# Patient Record
Sex: Female | Born: 1965 | Race: White | Hispanic: No | Smoking: Current every day smoker
Health system: Southern US, Community
[De-identification: ages and names within clinical notes are randomized; demographics above are authoritative.]

## PROBLEM LIST (undated history)

## (undated) DIAGNOSIS — Z720 Tobacco use: Secondary | ICD-10-CM

## (undated) DIAGNOSIS — J309 Allergic rhinitis, unspecified: Secondary | ICD-10-CM

## (undated) DIAGNOSIS — F39 Unspecified mood [affective] disorder: Secondary | ICD-10-CM

## (undated) DIAGNOSIS — N83209 Unspecified ovarian cyst, unspecified side: Secondary | ICD-10-CM

## (undated) DIAGNOSIS — K219 Gastro-esophageal reflux disease without esophagitis: Secondary | ICD-10-CM

## (undated) DIAGNOSIS — L680 Hirsutism: Secondary | ICD-10-CM

## (undated) DIAGNOSIS — N879 Dysplasia of cervix uteri, unspecified: Secondary | ICD-10-CM

## (undated) DIAGNOSIS — L919 Hypertrophic disorder of the skin, unspecified: Secondary | ICD-10-CM

## (undated) DIAGNOSIS — F32A Depression, unspecified: Secondary | ICD-10-CM

## (undated) DIAGNOSIS — I10 Essential (primary) hypertension: Secondary | ICD-10-CM

## (undated) DIAGNOSIS — F419 Anxiety disorder, unspecified: Secondary | ICD-10-CM

## (undated) DIAGNOSIS — G4700 Insomnia, unspecified: Secondary | ICD-10-CM

## (undated) DIAGNOSIS — C801 Malignant (primary) neoplasm, unspecified: Secondary | ICD-10-CM

## (undated) DIAGNOSIS — N812 Incomplete uterovaginal prolapse: Secondary | ICD-10-CM

## (undated) DIAGNOSIS — L909 Atrophic disorder of skin, unspecified: Secondary | ICD-10-CM

## (undated) DIAGNOSIS — M47816 Spondylosis without myelopathy or radiculopathy, lumbar region: Secondary | ICD-10-CM

## (undated) DIAGNOSIS — J45909 Unspecified asthma, uncomplicated: Secondary | ICD-10-CM

## (undated) DIAGNOSIS — F329 Major depressive disorder, single episode, unspecified: Secondary | ICD-10-CM

## (undated) DIAGNOSIS — IMO0002 Reserved for concepts with insufficient information to code with codable children: Secondary | ICD-10-CM

## (undated) HISTORY — DX: Insomnia, unspecified: G47.00

## (undated) HISTORY — DX: Unspecified mood (affective) disorder: F39

## (undated) HISTORY — DX: Malignant (primary) neoplasm, unspecified: C80.1

## (undated) HISTORY — DX: Atrophic disorder of skin, unspecified: L90.9

## (undated) HISTORY — DX: Incomplete uterovaginal prolapse: N81.2

## (undated) HISTORY — DX: Hirsutism: L68.0

## (undated) HISTORY — PX: ABDOMINAL HYSTERECTOMY: SHX81

## (undated) HISTORY — DX: Allergic rhinitis, unspecified: J30.9

## (undated) HISTORY — DX: Reserved for concepts with insufficient information to code with codable children: IMO0002

## (undated) HISTORY — PX: OTHER SURGICAL HISTORY: SHX169

## (undated) HISTORY — DX: Gastro-esophageal reflux disease without esophagitis: K21.9

## (undated) HISTORY — DX: Unspecified asthma, uncomplicated: J45.909

## (undated) HISTORY — DX: Dysplasia of cervix uteri, unspecified: N87.9

## (undated) HISTORY — DX: Hypertrophic disorder of the skin, unspecified: L91.9

## (undated) HISTORY — DX: Unspecified ovarian cyst, unspecified side: N83.209

## (undated) HISTORY — PX: CHOLECYSTECTOMY: SHX55

---

## 2007-05-28 ENCOUNTER — Ambulatory Visit: Payer: Self-pay | Admitting: Unknown Physician Specialty

## 2007-06-05 ENCOUNTER — Emergency Department: Payer: Self-pay | Admitting: Emergency Medicine

## 2007-07-05 ENCOUNTER — Emergency Department: Payer: Self-pay | Admitting: Emergency Medicine

## 2009-07-12 ENCOUNTER — Emergency Department: Payer: Self-pay | Admitting: Emergency Medicine

## 2009-09-03 ENCOUNTER — Ambulatory Visit: Payer: Self-pay | Admitting: Family Medicine

## 2009-11-24 ENCOUNTER — Encounter: Payer: Self-pay | Admitting: Family Medicine

## 2009-12-19 ENCOUNTER — Encounter: Payer: Self-pay | Admitting: Family Medicine

## 2010-02-02 ENCOUNTER — Encounter: Payer: Self-pay | Admitting: Family Medicine

## 2010-02-11 ENCOUNTER — Ambulatory Visit: Payer: Self-pay

## 2010-02-17 ENCOUNTER — Ambulatory Visit: Payer: Self-pay

## 2010-02-18 ENCOUNTER — Encounter: Payer: Self-pay | Admitting: Family Medicine

## 2010-03-21 ENCOUNTER — Encounter: Payer: Self-pay | Admitting: Family Medicine

## 2010-08-10 ENCOUNTER — Ambulatory Visit: Payer: Self-pay

## 2010-09-01 ENCOUNTER — Ambulatory Visit: Payer: Self-pay

## 2010-09-08 ENCOUNTER — Inpatient Hospital Stay: Payer: Self-pay

## 2010-09-13 LAB — PATHOLOGY REPORT

## 2011-01-24 ENCOUNTER — Ambulatory Visit: Payer: Self-pay | Admitting: Family Medicine

## 2011-05-19 ENCOUNTER — Other Ambulatory Visit: Payer: Self-pay | Admitting: Neurosurgery

## 2011-05-19 DIAGNOSIS — M545 Low back pain, unspecified: Secondary | ICD-10-CM

## 2011-05-22 ENCOUNTER — Ambulatory Visit
Admission: RE | Admit: 2011-05-22 | Discharge: 2011-05-22 | Disposition: A | Discharge status: Home / Self Care | Payer: Medicaid Other | Source: Ambulatory Visit | Attending: Neurosurgery | Admitting: Neurosurgery

## 2011-05-22 DIAGNOSIS — M545 Low back pain, unspecified: Secondary | ICD-10-CM

## 2012-03-15 ENCOUNTER — Emergency Department (HOSPITAL_COMMUNITY): Payer: Medicaid Other

## 2012-03-15 ENCOUNTER — Encounter (HOSPITAL_COMMUNITY): Payer: Self-pay | Admitting: Emergency Medicine

## 2012-03-15 ENCOUNTER — Other Ambulatory Visit: Payer: Self-pay

## 2012-03-15 ENCOUNTER — Observation Stay (HOSPITAL_COMMUNITY)
Admission: EM | Admit: 2012-03-15 | Discharge: 2012-03-16 | Disposition: A | Discharge status: Home / Self Care | Payer: Medicaid Other | Attending: Internal Medicine | Admitting: Internal Medicine

## 2012-03-15 DIAGNOSIS — F17200 Nicotine dependence, unspecified, uncomplicated: Secondary | ICD-10-CM | POA: Insufficient documentation

## 2012-03-15 DIAGNOSIS — R079 Chest pain, unspecified: Principal | ICD-10-CM | POA: Insufficient documentation

## 2012-03-15 DIAGNOSIS — K219 Gastro-esophageal reflux disease without esophagitis: Secondary | ICD-10-CM

## 2012-03-15 DIAGNOSIS — IMO0002 Reserved for concepts with insufficient information to code with codable children: Secondary | ICD-10-CM

## 2012-03-15 DIAGNOSIS — F329 Major depressive disorder, single episode, unspecified: Secondary | ICD-10-CM | POA: Insufficient documentation

## 2012-03-15 DIAGNOSIS — F3289 Other specified depressive episodes: Secondary | ICD-10-CM | POA: Insufficient documentation

## 2012-03-15 DIAGNOSIS — I10 Essential (primary) hypertension: Secondary | ICD-10-CM

## 2012-03-15 HISTORY — DX: Tobacco use: Z72.0

## 2012-03-15 HISTORY — DX: Major depressive disorder, single episode, unspecified: F32.9

## 2012-03-15 HISTORY — DX: Unspecified ovarian cyst, unspecified side: N83.209

## 2012-03-15 HISTORY — DX: Anxiety disorder, unspecified: F41.9

## 2012-03-15 HISTORY — DX: Essential (primary) hypertension: I10

## 2012-03-15 HISTORY — DX: Gastro-esophageal reflux disease without esophagitis: K21.9

## 2012-03-15 HISTORY — DX: Depression, unspecified: F32.A

## 2012-03-15 HISTORY — DX: Spondylosis without myelopathy or radiculopathy, lumbar region: M47.816

## 2012-03-15 LAB — CBC WITH DIFFERENTIAL/PLATELET
Basophils Absolute: 0.1 10*3/uL (ref 0.0–0.1)
Basophils Relative: 1 % (ref 0–1)
Eosinophils Absolute: 0.1 10*3/uL (ref 0.0–0.7)
MCH: 31.8 pg (ref 26.0–34.0)
MCHC: 33.9 g/dL (ref 30.0–36.0)
Monocytes Relative: 9 % (ref 3–12)
Neutrophils Relative %: 57 % (ref 43–77)
Platelets: 247 10*3/uL (ref 150–400)
RDW: 14.9 % (ref 11.5–15.5)

## 2012-03-15 LAB — COMPREHENSIVE METABOLIC PANEL
AST: 16 U/L (ref 0–37)
Albumin: 4 g/dL (ref 3.5–5.2)
Alkaline Phosphatase: 101 U/L (ref 39–117)
BUN: 7 mg/dL (ref 6–23)
Potassium: 4.1 mEq/L (ref 3.5–5.1)
Sodium: 136 mEq/L (ref 135–145)
Total Protein: 7.6 g/dL (ref 6.0–8.3)

## 2012-03-15 LAB — CARDIAC PANEL(CRET KIN+CKTOT+MB+TROPI)
CK, MB: 1.3 ng/mL (ref 0.3–4.0)
Relative Index: INVALID (ref 0.0–2.5)
Total CK: 46 U/L (ref 7–177)
Troponin I: <0.3 ng/mL (ref <0.30)

## 2012-03-15 LAB — TROPONIN I: Troponin I: <0.3 ng/mL (ref <0.30)

## 2012-03-15 LAB — MRSA PCR SCREENING: MRSA by PCR: NEGATIVE

## 2012-03-15 MED ORDER — ONDANSETRON HCL 4 MG/2ML IJ SOLN: 4.0000 mg | Freq: Four times a day (QID) | INTRAMUSCULAR | Status: DC | PRN

## 2012-03-15 MED ORDER — CLONAZEPAM 1 MG PO TABS
1.0000 mg | ORAL_TABLET | Freq: Three times a day (TID) | ORAL | Status: DC | PRN
Start: 2012-03-15 — End: 2012-03-16
  Administered 2012-03-15: 1 mg via ORAL
  Filled 2012-03-15: qty 1

## 2012-03-15 MED ORDER — ACETAMINOPHEN 500 MG PO TABS
1000.0000 mg | ORAL_TABLET | Freq: Four times a day (QID) | ORAL | Status: DC | PRN
  Filled 2012-03-15: qty 2

## 2012-03-15 MED ORDER — NITROGLYCERIN 0.4 MG SL SUBL
0.4000 mg | SUBLINGUAL_TABLET | SUBLINGUAL | Status: DC | PRN
  Administered 2012-03-15 (×2): 0.4 mg via SUBLINGUAL
  Filled 2012-03-15 (×2): qty 25

## 2012-03-15 MED ORDER — ASPIRIN EC 81 MG PO TBEC: 81.0000 mg | DELAYED_RELEASE_TABLET | Freq: Every day | ORAL | Status: DC

## 2012-03-15 MED ORDER — ACETAMINOPHEN 500 MG PO TABS: 1000.0000 mg | ORAL_TABLET | Freq: Four times a day (QID) | ORAL | Status: DC | PRN

## 2012-03-15 MED ORDER — MONTELUKAST SODIUM 10 MG PO TABS
10.0000 mg | ORAL_TABLET | Freq: Every day | ORAL | Status: DC
  Administered 2012-03-15: 10 mg via ORAL
  Filled 2012-03-15 (×2): qty 1

## 2012-03-15 MED ORDER — PREGABALIN 50 MG PO CAPS
100.0000 mg | ORAL_CAPSULE | Freq: Two times a day (BID) | ORAL | Status: DC
  Administered 2012-03-15 – 2012-03-16 (×2): 100 mg via ORAL
  Filled 2012-03-15 (×2): qty 1

## 2012-03-15 MED ORDER — OXYCODONE-ACETAMINOPHEN 5-325 MG PO TABS
1.0000 | ORAL_TABLET | ORAL | Status: DC | PRN
  Administered 2012-03-15 – 2012-03-16 (×3): 2 via ORAL
  Filled 2012-03-15 (×3): qty 2

## 2012-03-15 MED ORDER — HEPARIN SODIUM (PORCINE) 5000 UNIT/ML IJ SOLN: 5000.0000 [IU] | Freq: Three times a day (TID) | INTRAMUSCULAR | Status: DC

## 2012-03-15 MED ORDER — SODIUM CHLORIDE 0.9 % IJ SOLN: 3.0000 mL | INTRAMUSCULAR | Status: DC | PRN

## 2012-03-15 MED ORDER — ASPIRIN EC 81 MG PO TBEC
81.0000 mg | DELAYED_RELEASE_TABLET | Freq: Every day | ORAL | Status: DC
  Administered 2012-03-16: 81 mg via ORAL
  Filled 2012-03-15: qty 1

## 2012-03-15 MED ORDER — NITROGLYCERIN 0.4 MG SL SUBL: 0.4000 mg | SUBLINGUAL_TABLET | SUBLINGUAL | Status: DC | PRN

## 2012-03-15 MED ORDER — DULOXETINE HCL 60 MG PO CPEP
60.0000 mg | ORAL_CAPSULE | Freq: Every day | ORAL | Status: DC
  Administered 2012-03-15: 60 mg via ORAL
  Filled 2012-03-15 (×2): qty 1

## 2012-03-15 MED ORDER — PANTOPRAZOLE SODIUM 40 MG PO TBEC
40.0000 mg | DELAYED_RELEASE_TABLET | Freq: Once | ORAL | Status: AC
  Administered 2012-03-15: 40 mg via ORAL
  Filled 2012-03-15: qty 1

## 2012-03-15 MED ORDER — PANTOPRAZOLE SODIUM 40 MG PO TBEC: 40.0000 mg | DELAYED_RELEASE_TABLET | Freq: Every day | ORAL | Status: DC

## 2012-03-15 MED ORDER — SODIUM CHLORIDE 0.9 % IV SOLN: 250.0000 mL | INTRAVENOUS | Status: DC | PRN

## 2012-03-15 MED ORDER — PREGABALIN 100 MG PO CAPS
100.0000 mg | ORAL_CAPSULE | Freq: Two times a day (BID) | ORAL | Status: DC
  Filled 2012-03-15: qty 2

## 2012-03-15 MED ORDER — HEPARIN SODIUM (PORCINE) 5000 UNIT/ML IJ SOLN
5000.0000 [IU] | Freq: Three times a day (TID) | INTRAMUSCULAR | Status: DC
  Administered 2012-03-15 – 2012-03-16 (×2): 5000 [IU] via SUBCUTANEOUS
  Filled 2012-03-15 (×5): qty 1

## 2012-03-15 MED ORDER — LISINOPRIL 20 MG PO TABS
20.0000 mg | ORAL_TABLET | Freq: Every day | ORAL | Status: DC
  Administered 2012-03-15: 20 mg via ORAL
  Filled 2012-03-15 (×2): qty 1

## 2012-03-15 MED ORDER — SODIUM CHLORIDE 0.9 % IJ SOLN
3.0000 mL | Freq: Two times a day (BID) | INTRAMUSCULAR | Status: DC
  Administered 2012-03-15: 3 mL via INTRAVENOUS

## 2012-03-15 NOTE — ED Notes (Signed)
Resp. Even and unlabored. Skin warm dry and intact. A.O. X 4. Denies pain. Vitals stable. Family at bedside. Pt. Updated on plan of care. Informed that reports has been called and she will be moved to the floor as soon as possible.

## 2012-03-15 NOTE — ED Notes (Signed)
Report given by Maxine Allie Ousley, RN.  Preparing patient for transport.

## 2012-03-15 NOTE — H&P (Signed)
Hospital Admission Note Date: 03/15/2012  Patient name: Ana Butler Medical record number: [id number] Date of birth: [birth year] Age: 46 y.o. Gender: female PCP: No primary provider on file.   Attending Physician: Dr. Rogelia Boga  First Contact:  Dr. Earlene Plater   Pager:  [phone] Second Contact:  Dr. Saralyn Pilar    Pager:  [phone]      After Hours (after 5PM)/ Weekend / Holidays: First Contact:              Pager: [phone] Second Contact:         Pager: [phone]   Chief Complaint: chest pain   History of Present Illness:  Patient is a 46 y.o., female with a history of hypertension, depression, and a family history of early CAD, who presents to [facility] with chest pain.  The pain began around 7:30 the morning of admission while the patient was riding in the car.  The pain was an ache and it progressively worsened over the morning.  The pain radiated to her left mandible, left brachium, and through to her left scapula.  At noon, the patient took 325mg  of ASA which reduced the pain, but did not resolve it.  She then sought care at the ED.  Once here, she was given a nitroglycerin tablet which completely resolved the pain almost immediately.  There were no aggravating factors.  There were no associated symptoms; including headache, dizziness, nausea, vomiting, dyspnea, and diaphoresis.  She also reports a 2 year history of chest pain that comes on randomly (not brought on by exertion) and resolves without intervention after 1 to 2 hours.  These episodes happen about once or twice a month.   Review of Systems: Constitutional: Negative for fever, chills, weight loss and diaphoresis.  HENT: Negative for hearing loss, congestion, sore throat and tinnitus.   Eyes: Negative for blurred vision and double vision.  Respiratory: Negative for cough and shortness of breath.   Cardiovascular: Positive for chest pain. Negative for palpitations.  Gastrointestinal: Negative for heartburn, nausea, vomiting and  abdominal pain.  Musculoskeletal: Positive for back pain.  Neurological: Negative for dizziness and headaches.  Psychiatric/Behavioral: Negative for depression.    Past Medical History: Past Medical History  Diagnosis Date  . Hypertension   . Acid reflux   . Depression   . Ovarian cyst   . DJD (degenerative joint disease), lumbar   . Tobacco abuse   . Anxiety    Past Surgical History  Procedure Date  . Cholecystectomy   . Abdominal hysterectomy   . Bilateral tubal ligation     Home Medications: Current Outpatient Rx  Name Route Sig Dispense Refill  . CLONAZEPAM 1 MG PO TABS Oral Take 1 mg by mouth 3 (three) times daily as needed. For anxiety    . DULOXETINE HCL 60 MG PO CPEP Oral Take 60 mg by mouth at bedtime.    Marland Kitchen LISINOPRIL 20 MG PO TABS Oral Take 20 mg by mouth at bedtime.    Marland Kitchen MONTELUKAST SODIUM 10 MG PO TABS Oral Take 10 mg by mouth at bedtime.    . OMEPRAZOLE 20 MG PO CPDR Oral Take 20 mg by mouth 2 (two) times daily.    Marland Kitchen PREGABALIN 100 MG PO CAPS Oral Take 100 mg by mouth 2 (two) times daily.    . VENLAFAXINE HCL ER 75 MG PO CP24 Oral Take 250 mg by mouth at bedtime.      Allergies: Allergies as of 03/15/2012 - Review Complete 03/15/2012  Allergen Reaction  Noted  . Prednisone Other (See Comments) 03/15/2012  . Shellfish allergy Nausea And Vomiting and Swelling 03/15/2012  . Silver nitrate Other (See Comments) 03/15/2012  . Dilaudid (hydromorphone hcl) Other (See Comments) 03/15/2012    Family and Social History: Family History  Problem Relation Age of Onset  . Breast cancer Sister   . Uterine cancer Mother   . Breast cancer Maternal Grandmother   . Uterine cancer Maternal Grandmother   . Hypertension    . Diabetes type II    . Heart attack Father 69  . Heart attack Paternal Grandmother 45  . Heart attack Maternal Aunt    History   Social History  . Marital Status: Divorced    Spouse Name: N/A    Number of Children: 5  . Years of Education: AA     Occupational History  . Not on file.   Social History Main Topics  . Smoking status: Current Everyday Smoker -- 2.5 packs/day for 40 years    Types: Cigarettes  . Smokeless tobacco: Not on file  . Alcohol Use: Yes     3-4 mixed drinks 1-2 times per week  . Drug Use: No  . Sexually Active: Not on file   Other Topics Concern  . Not on file   Social History Narrative  . No narrative on file    Physical Exam: Vitals: Blood pressure 118/75, pulse 79, temperature 98.2 F (36.8 C), resp. rate 16, SpO2 97.00%. Constitutional: She appears well-developed and well-nourished.  HENT:  Head: Normocephalic and atraumatic.  Mouth/Throat: Uvula is midline, oropharynx is clear and moist and mucous membranes are normal.  Eyes: Conjunctivae and EOM are normal. Pupils are equal, round, and reactive to light.  Neck: Full passive range of motion without pain. Neck supple. Carotid bruit is not present.  Cardiovascular: Normal rate, regular rhythm and normal heart sounds.   Pulses:      Radial pulses are 2+ on the right side, and 2+ on the left side.       Dorsalis pedis pulses are 2+ on the right side, and 2+ on the left side.       Posterior tibial pulses are 2+ on the right side, and 2+ on the left side.  Respiratory: Effort normal and breath sounds normal.  GI: Soft. Bowel sounds are normal. She exhibits no mass. There is no hepatosplenomegaly. There is no tenderness.  Lymphadenopathy:    She has no cervical adenopathy.  Neurological: She is alert.  Psychiatric: She has a normal mood and affect. Her speech is normal and behavior is normal. Thought content normal.    Lab results: Basic Metabolic Panel:  Basename 03/15/12 1236  NA 136  K 4.1  CL 99  CO2 25  GLUCOSE 102*  BUN 7  CREATININE 0.72  CALCIUM 10.1  MG --  PHOS --   Liver Function Tests:  Basename 03/15/12 1236  AST 16  ALT 26  ALKPHOS 101  BILITOT 0.2*  PROT 7.6  ALBUMIN 4.0   CBC:  Basename 03/15/12 1236   WBC 7.3  NEUTROABS 4.2  HGB 15.0  HCT 44.2  MCV 93.6  PLT 247   Cardiac Enzymes:  Basename 03/15/12 1235  CKTOTAL --  CKMB --  CKMBINDEX --  TROPONINI <0.30    Imaging results:  Dg Chest 2 View 03/15/2012 IMPRESSION: No acute finding.   Other results: EKG: normal EKG, normal sinus rhythm, there are no previous tracings available for comparison.  Assessment & Plan by Problem:  This is a 46 year old woman with numerous risk factors for CAD/ACS that presents with anginal chest pain.  1.   Chest pain:  The most likely etiology is cardiac in nature given the HPI, the patient's history, and her family history.  Although not brought on by exertion, the dramatic response to nitroglycerin supports coronary hypoperfusion.  This response can be seen in GERD (which she has), but her GERD has been well controlled with no recent symptoms, the pain radiated into the left jaw and left arm, and she was upright when the pain began suddenly.  The history of radiation to the back raises the concern for aortic dissection, but cardiac auscultation, chest roentgenography, and radial pulses are normal.   She has no tenderness in the chest wall on exam, making MSK etiologies less likely. An infectious process in the lungs or pleural space would present more gradually and she has no symptoms to support this.  With her prolific smoking history, she is certainly at risk for pulmonary neoplasms, but this too would present indolently. - Admit to inpatient telemetry - Cycle cardiac enzymes overnight x3 - Hemoglobin A1c and fasting lipid panel for risk stratification - Repeat EKG & CBC in the AM - Begin daily aspirin therapy - Tobacco cessation counseling - Nitroglycerin PRN for chest pain  2.   Hypertension:  This seems well controlled, at least at admission.  We will continue her home lisinopril at 20mg  daily. - Lisinopril 20mg  daily  3.   Depression:  Stable at admission.  We will continue home regimen  except for holding the SNRI while she is on an SSRI. - Duloxetine 60mg  daily - Holding venlafaxine XR  4.   Degenerative disk disease:  Stable at admission.  Continuing home medications. - Lyrica 100mg  BID - Percocet 10/325 1-2 PRN q4  5.   GERD:   Stable at admission.  Continuing home medications. - Pantoprazole 40mg  daily  6.   Prophylaxis:  heparin   Signed by:  Dorthula Rue. Earlene Plater, MD PGY-I, Internal Medicine  03/15/2012, 4:55 PM

## 2012-03-15 NOTE — ED Notes (Signed)
Patient transported to X-ray 

## 2012-03-15 NOTE — ED Notes (Signed)
Reports onset CP 0730 while sitting in a car. Left side CP which radiates up to left side neck/jaw, left shoulder & then thru to left shoulder blade. Mild SOB earlier, denies presently. Denies n/v, diaphoresis, fever, cold, cough. No increase in pain with mvmt, deep breaths or palpation.

## 2012-03-15 NOTE — ED Notes (Signed)
EKG shown to Dr. Freida Busman, no changes noted.

## 2012-03-15 NOTE — ED Notes (Signed)
Started this am in left arm goes thru to chest took 4 baby asa  Rads to jaw and neck

## 2012-03-15 NOTE — ED Notes (Signed)
Admitting MD at bedside.

## 2012-03-15 NOTE — ED Provider Notes (Signed)
History     CSN: [id number]  Arrival date & time 03/15/12  1151   First MD Initiated Contact with Patient 03/15/12 1241      Chief Complaint  Patient presents with  . Chest Pain    (Consider location/radiation/quality/duration/timing/severity/associated sxs/prior treatment) HPI  46 y/o female INAD c/o left sided CP that radaites to left jaw and arm onset at 7: 30 AM today. Pain is described as achy, 6/10, non-positional and non-exertional. Denies SOB and N/V. Denies h/o of cardiac cath or stress test. Now pain is 4/10. Denies trauma. Denies prior similar episodes.   RF: HTN, Current Smoker: 60pack year history, FH: Father had MI at 48  Past Medical History  Diagnosis Date  . Hypertension   . Acid reflux   . Depression     Past Surgical History  Procedure Date  . Cholecystectomy   . Abdominal hysterectomy     No family history on file.  History  Substance Use Topics  . Smoking status: Current Everyday Smoker  . Smokeless tobacco: Not on file  . Alcohol Use: Yes    OB History    Grav Para Term Preterm Abortions TAB SAB Ect Mult Living                  Review of Systems  Constitutional: Negative for fatigue.  Respiratory: Positive for apnea. Negative for shortness of breath.   Cardiovascular: Positive for chest pain. Negative for palpitations and leg swelling.  Gastrointestinal: Negative for nausea, vomiting and abdominal pain.  Musculoskeletal: Negative for back pain.  Skin: Negative for rash.  All other systems reviewed and are negative.    Allergies  Dilaudid; Prednisone; Shellfish allergy; and Silver nitrate  Home Medications  No current outpatient prescriptions on file.  BP 136/79  Pulse 94  Temp 98.2 F (36.8 C)  Resp 16  SpO2 100%  Physical Exam  Nursing note and vitals reviewed. Constitutional: She is oriented to person, place, and time. She appears well-developed and well-nourished. No distress.  HENT:  Head: Normocephalic and  atraumatic.  Right Ear: External ear normal.  Left Ear: External ear normal.  Eyes: Conjunctivae and EOM are normal. Pupils are equal, round, and reactive to light.       Ruddy conjunctiva  Neck: Normal range of motion. No JVD present.  Cardiovascular: Normal rate, regular rhythm, normal heart sounds and intact distal pulses.  Exam reveals no gallop and no friction rub.   No murmur heard. Pulmonary/Chest: Effort normal and breath sounds normal. No stridor. She exhibits no tenderness.  Abdominal: Soft. Bowel sounds are normal. She exhibits no distension and no mass. There is no tenderness. There is no rebound and no guarding.  Musculoskeletal: Normal range of motion. She exhibits no edema and no tenderness.  Neurological: She is alert and oriented to person, place, and time.  Skin: Skin is warm. She is not diaphoretic.  Psychiatric: She has a normal mood and affect.    ED Course  Procedures (including critical care time)  Labs Reviewed  COMPREHENSIVE METABOLIC PANEL - Abnormal; Notable for the following:    Glucose, Bld 102 (*)     Total Bilirubin 0.2 (*)     All other components within normal limits  TROPONIN I  CBC WITH DIFFERENTIAL   Dg Chest 2 View  03/15/2012  *RADIOLOGY REPORT*  Clinical Data: Chest pain.  COPD.  CHEST - 2 VIEW  Comparison: None.  Findings: Lungs are clear.  Heart size is normal.  No  pneumothorax or effusion.  Degenerative change thoracic spine noted.  IMPRESSION: No acute finding.  Original Report Authenticated By: Bernadene Bell. D'ALESSIO, M.D.     1. Chest pain       MDM  Pt is wells criteria low risk and PERC negative. Pt took 325 ASA today. 1 NTG SL resolved all of her pain. 2x negative troponin.   Date: 03/15/2012  Rate: 91  Rhythm: normal sinus rhythm  QRS Axis: normal  Intervals: normal  ST/T Wave abnormalities: normal  Conduction Disutrbances:none  Narrative Interpretation:   Old EKG Reviewed: none available  Pt will be admitted for low-risk  cardiac rule out. Shared visit with attending Dr. Patria Mane.          Wynetta Emery, PA-C 03/15/12 1811

## 2012-03-16 ENCOUNTER — Observation Stay (HOSPITAL_COMMUNITY): Payer: Medicaid Other

## 2012-03-16 LAB — CBC
MCHC: 33.4 g/dL (ref 30.0–36.0)
Platelets: 222 10*3/uL (ref 150–400)
RDW: 15 % (ref 11.5–15.5)
WBC: 5.4 10*3/uL (ref 4.0–10.5)

## 2012-03-16 LAB — LIPID PANEL
HDL: 26 mg/dL — ABNORMAL LOW (ref >39)
LDL Cholesterol: 93 mg/dL (ref 0–99)
Total CHOL/HDL Ratio: 6.6 RATIO

## 2012-03-16 LAB — CARDIAC PANEL(CRET KIN+CKTOT+MB+TROPI)
CK, MB: 1.1 ng/mL (ref 0.3–4.0)
Relative Index: INVALID (ref 0.0–2.5)
Troponin I: <0.3 ng/mL (ref <0.30)

## 2012-03-16 LAB — HEMOGLOBIN A1C
Hgb A1c MFr Bld: 5.2 % (ref <5.7)
Mean Plasma Glucose: 103 mg/dL (ref <117)

## 2012-03-16 MED ORDER — VENLAFAXINE HCL ER 75 MG PO CP24
225.0000 mg | ORAL_CAPSULE | Freq: Every day | ORAL | Status: DC
  Filled 2012-03-16 (×2): qty 3

## 2012-03-16 MED ORDER — ASPIRIN 81 MG PO TBEC: 81.0000 mg | DELAYED_RELEASE_TABLET | Freq: Every day | ORAL | Status: DC

## 2012-03-16 MED ORDER — NITROGLYCERIN 0.4 MG SL SUBL: 0.4000 mg | SUBLINGUAL_TABLET | SUBLINGUAL | Status: DC | PRN

## 2012-03-16 NOTE — Progress Notes (Signed)
Subjective:    Interval Events:  Ana Butler had one more episode of chest pain last night that resolved after a dose of nitroglycerin.  She is otherwise without complaint.  She has no dizziness, headaches, or dyspnea.    Objective:    Vital Signs:   Temp:  [97.8 F (36.6 C)-98.4 F (36.9 C)] 98.4 F (36.9 C) (07/27 0550) Pulse Rate:  [72-94] 72  (07/27 0550) Resp:  [14-21] 18  (07/27 0550) BP: (106-136)/(57-80) 127/78 mmHg (07/27 0550) SpO2:  [96 %-100 %] 98 % (07/27 0550) Weight:  [192 lb 6.4 oz (87.272 kg)] 192 lb 6.4 oz (87.272 kg) (07/26 2037) Last BM Date: 03/15/12   Weights: 24-hour Weight change:   Filed Weights   03/15/12 2037  Weight: 192 lb 6.4 oz (87.272 kg)     Intake/Output:   Intake/Output Summary (Last 24 hours) at 03/16/12 1006 Last data filed at 03/16/12 0848  Gross per 24 hour  Intake    360 ml  Output      0 ml  Net    360 ml       Physical Exam: General appearance: alert, cooperative and no distress Resp: clear to auscultation bilaterally Cardio: regular rate and rhythm, S1, S2 normal, no murmur, click, rub or gallop    Labs: Basic Metabolic Panel:  Lab 03/15/12 1610  NA 136  K 4.1  CL 99  CO2 25  GLUCOSE 102*  BUN 7  CREATININE 0.72  CALCIUM 10.1  MG --  PHOS --    Liver Function Tests:  Lab 03/15/12 1236  AST 16  ALT 26  ALKPHOS 101  BILITOT 0.2*  PROT 7.6  ALBUMIN 4.0   No results found for this basename: LIPASE:5,AMYLASE:5 in the last 168 hours No results found for this basename: AMMONIA:3 in the last 168 hours  CBC:  Lab 03/16/12 0500 03/15/12 1236  WBC 5.4 7.3  NEUTROABS -- 4.2  HGB 13.5 15.0  HCT 40.4 44.2  MCV 93.1 93.6  PLT 222 247    Cardiac Enzymes:  Lab 03/16/12 0551 03/16/12 0005 03/15/12 1810 03/15/12 1235  CKTOTAL 32 36 46 --  CKMB 1.1 1.3 1.3 --  CKMBINDEX -- -- -- --  TROPONINI <0.30 <0.30 <0.30 <0.30    BNP: No components found with this basename: POCBNP:5  CBG: No results  found for this basename: GLUCAP:5 in the last 168 hours  Microbiology: Results for orders placed during the hospital encounter of 03/15/12  MRSA PCR SCREENING     Status: Normal   Collection Time   03/15/12  8:54 PM      Component Value Range Status Comment   MRSA by PCR NEGATIVE  NEGATIVE Final     Coagulation Studies: No results found for this basename: LABPROT:5,INR:5 in the last 72 hours   Other results: EKG: normal EKG, normal sinus rhythm, unchanged from previous tracings.   Imaging: Dg Chest 2 View 03/15/2012 IMPRESSION: No acute finding.      Medications:    Scheduled Medications:    . aspirin EC  81 mg Oral Daily  . DULoxetine  60 mg Oral QHS  . heparin  5,000 Units Subcutaneous Q8H  . lisinopril  20 mg Oral QHS  . montelukast  10 mg Oral QHS  . pantoprazole  40 mg Oral Q1200  . pantoprazole  40 mg Oral Once  . pregabalin  100 mg Oral BID  . pregabalin  100 mg Oral BID  . sodium chloride  3  mL Intravenous Q12H  . venlafaxine XR  225 mg Oral QHS  . DISCONTD: aspirin EC  81 mg Oral Daily  . DISCONTD: heparin  5,000 Units Subcutaneous Q8H     PRN Medications: sodium chloride, acetaminophen, clonazePAM, nitroGLYCERIN, ondansetron (ZOFRAN) IV, oxyCODONE-acetaminophen, sodium chloride, DISCONTD: acetaminophen, DISCONTD: nitroGLYCERIN, DISCONTD: nitroGLYCERIN, DISCONTD: ondansetron (ZOFRAN) IV   Assessment/ Plan:    1. Chest pain: The most likely etiology is cardiac in nature given the HPI, the patient's history, and her family history. Although not brought on by exertion, the dramatic response to nitroglycerin supports coronary hypoperfusion. Perhaps equally as likely is GERD.  This response to nitroglycerin can be seen in GERD (which she has).  It would be a little unusual for chest pain secondary to GERD to occur while the patient is upright, but she does endorse some symptoms of morning cough, morning hoarseness, and occasional sour taste in the mornings.  The  history of radiation to the back raises the concern for aortic dissection, but cardiac auscultation, chest roentgenography, and radial pulses are normal. She has no tenderness in the chest wall on exam, making MSK etiologies less likely. An infectious process in the lungs or pleural space would present more gradually and she has no symptoms to support this. With her prolific smoking history, she is certainly at risk for pulmonary neoplasms, but this too would present indolently.  Cardiac enzymes and repeat EKG were normal.  Chest roentgenogram on admission demonstrated an opacity in the right lung suspicious for a pulmonary nodule.  Repeat roentgenogram failed to reproduce this.  A1c was 5.2 and FLP is pending.  We will begin daily ASA therapy, provide her with SL nitroglycerin, and recommend a nuclear stress test as an outpatient.  She also might benefit from statin therapy, depending on the results of the stress test.  2. Hypertension: This seems well controlled, at least at admission. We will continue her home lisinopril at 20mg  daily.   3. Depression: Stable at admission. We will continue home regimen.  4. Degenerative disk disease: Stable at admission. Continuing home medications.   5. GERD: Stable at admission. Continuing home medications.   6. Prophylaxis: heparin  7. Disposition:  Discharge home   Length of Stay: 1 days   Signed by:  Dorthula Rue. Earlene Plater, MD PGY-I, Internal Medicine Pager [phone]  03/16/2012, 10:06 AM

## 2012-03-16 NOTE — Discharge Summary (Signed)
Patient Name:  Ana Butler MRN: [id number]  PCP: No primary provider on file. DOB:  14-Jan-1966       Date of Admission:  03/15/2012  Date of Discharge:  03/16/2012      Attending Physician: Burns Spain, MD         DISCHARGE DIAGNOSES: 1.   Chest pain: - Hard to discern if this was cardiac in nature or GERD - EKG and CEs negative - Added aspirin and provided nitroglycerin for PRN use   DISPOSITION AND FOLLOW-UP: Ana Butler is to follow-up with the listed providers as detailed below, at which time, the following should be addressed:   1. Follow-up visits: 1. PRIMARY CARE 1. Patient needs a stress test.  Nuclear stress test would be more specific because of gender and deconditioned status. 2. May benefit from statin therapy depending on stress test findings. 3. Hgb A1c was 5.2, TGY high at 265, LDL at 93.  2. Labs / imaging needed: 1. Nuclear stress test   Follow-up Information    Follow up with [name] in 1 week. (PRIMARY CARE - Please see your PCP within the next 1 week, you will need to call for an appointment. You need to be set up for a stress test as an outpatient.)    Contact information:   [address]  [place], [place] [address]  [phone]         Discharge Orders    Future Orders Please Complete By Expires   Diet - low sodium heart healthy      Increase activity slowly      Call MD for:  severe uncontrolled pain      Call MD for:  persistant nausea and vomiting      Call MD for:  difficulty breathing, headache or visual disturbances      Call MD for:  persistant dizziness or light-headedness      Call MD for:  extreme fatigue          DISCHARGE MEDICATIONS: Medication List  As of 03/16/2012 10:22 AM   TAKE these medications         aspirin 81 MG EC tablet   Take 1 tablet (81 mg total) by mouth daily.      clonazePAM 1 MG tablet   Commonly known as: KLONOPIN   Take 1 mg by mouth 3 (three) times daily as needed. For  anxiety      DULoxetine 60 MG capsule   Commonly known as: CYMBALTA   Take 60 mg by mouth at bedtime.      lisinopril 20 MG tablet   Commonly known as: PRINIVIL,ZESTRIL   Take 20 mg by mouth at bedtime.      montelukast 10 MG tablet   Commonly known as: SINGULAIR   Take 10 mg by mouth at bedtime.      nitroGLYCERIN 0.4 MG SL tablet   Commonly known as: NITROSTAT   Place 1 tablet (0.4 mg total) under the tongue every 5 (five) minutes x 3 doses as needed for chest pain (Seek medical attention if pain persists.).      omeprazole 20 MG capsule   Commonly known as: PRILOSEC   Take 20 mg by mouth 2 (two) times daily.      pregabalin 100 MG capsule   Commonly known as: LYRICA   Take 100 mg by mouth 2 (two) times daily.      venlafaxine XR 75 MG 24 hr capsule  Commonly known as: EFFEXOR-XR   Take 225 mg by mouth at bedtime.            PROCEDURES PERFORMED:  Dg Chest 2 View 03/15/2012 IMPRESSION: No acute finding.     ADMISSION DATA: H&P:  Patient is a 46 y.o., female with a history of hypertension, depression, and a family history of early CAD, who presents to [facility] with chest pain. The pain began around 7:30 the morning of admission while the patient was riding in the car. The pain was an ache and it progressively worsened over the morning. The pain radiated to her left mandible, left brachium, and through to her left scapula. At noon, the patient took 325mg  of ASA which reduced the pain, but did not resolve it. She then sought care at the ED. Once here, she was given a nitroglycerin tablet which completely resolved the pain almost immediately. There were no aggravating factors. There were no associated symptoms; including headache, dizziness, nausea, vomiting, dyspnea, and diaphoresis.  She also reports a 2 year history of chest pain that comes on randomly (not brought on by exertion) and resolves without intervention after 1 to 2 hours. These episodes happen about once or twice a  month.  Physical Exam: Vitals: Blood pressure 118/75, pulse 79, temperature 98.2 F (36.8 C), resp. rate 16, SpO2 97.00%.  Constitutional: She appears well-developed and well-nourished.  HENT:  Head: Normocephalic and atraumatic.  Mouth/Throat: Uvula is midline, oropharynx is clear and moist and mucous membranes are normal.  Eyes: Conjunctivae and EOM are normal. Pupils are equal, round, and reactive to light.  Neck: Full passive range of motion without pain. Neck supple. Carotid bruit is not present.  Cardiovascular: Normal rate, regular rhythm and normal heart sounds.  Pulses:  Radial pulses are 2+ on the right side, and 2+ on the left side.  Dorsalis pedis pulses are 2+ on the right side, and 2+ on the left side.  Posterior tibial pulses are 2+ on the right side, and 2+ on the left side.  Respiratory: Effort normal and breath sounds normal.  GI: Soft. Bowel sounds are normal. She exhibits no mass. There is no hepatosplenomegaly. There is no tenderness.  Lymphadenopathy:  She has no cervical adenopathy.  Neurological: She is alert.  Psychiatric: She has a normal mood and affect. Her speech is normal and behavior is normal. Thought content normal.   Labs: Basic Metabolic Panel:  Basename  03/15/12 1236   NA  136   K  4.1   CL  99   CO2  25   GLUCOSE  102*   BUN  7   CREATININE  0.72   CALCIUM  10.1   MG  --   PHOS  --    Liver Function Tests:  Basename  03/15/12 1236   AST  16   ALT  26   ALKPHOS  101   BILITOT  0.2*   PROT  7.6   ALBUMIN  4.0    CBC:  Basename  03/15/12 1236   WBC  7.3   NEUTROABS  4.2   HGB  15.0   HCT  44.2   MCV  93.6   PLT  247    Cardiac Enzymes:  Basename  03/15/12 1235   CKTOTAL  --   CKMB  --   CKMBINDEX  --   TROPONINI  <0.30     HOSPITAL COURSE: 1. Chest pain: The most likely etiology is cardiac in nature given the HPI, the  patient's history, and her family history. Although not brought on by exertion, the dramatic response  to nitroglycerin supports coronary hypoperfusion. Perhaps equally as likely is GERD. This response to nitroglycerin can be seen in GERD (which she has). It would be a little unusual for chest pain secondary to GERD to occur while the patient is upright, but she does endorse some symptoms of morning cough, morning hoarseness, and occasional sour taste in the mornings. The history of radiation to the back raises the concern for aortic dissection, but cardiac auscultation, chest roentgenography, and radial pulses are normal. She has no tenderness in the chest wall on exam, making MSK etiologies unlikely. An infectious process in the lungs or pleural space would present more gradually and she has no symptoms to support this. With her prolific smoking history, she is certainly at risk for pulmonary neoplasms, but this too would present indolently. Cardiac enzymes and repeat EKG were normal. Chest roentgenogram on admission demonstrated an opacity in the right lung suspicious for a pulmonary nodule. Repeat roentgenogram failed to reproduce this. A1c was 5.2 and FLP demonstrates only a slightly elevated triglyceride level and a LDL level of 93. We will begin daily ASA therapy, provide her with SL nitroglycerin, and recommend a nuclear stress test as an outpatient. She also might benefit from statin therapy, depending on the results of the stress test.   DISCHARGE DATA: Vital Signs: BP 127/78  Pulse 72  Temp 98.4 F (36.9 C) (Oral)  Resp 18  Ht 5\' 4"  (1.626 m)  Wt 192 lb 6.4 oz (87.272 kg)  BMI 33.03 kg/m2  SpO2 98%  Labs: Results for orders placed during the hospital encounter of 03/15/12 (from the past 24 hour(s))  TROPONIN I     Status: Normal   Collection Time   03/15/12 12:35 PM      Component Value Range   Troponin I <0.30  <0.30 ng/mL  CBC WITH DIFFERENTIAL     Status: Normal   Collection Time   03/15/12 12:36 PM      Component Value Range   WBC 7.3  4.0 - 10.5 K/uL   RBC 4.72  3.87 - 5.11  MIL/uL   Hemoglobin 15.0  12.0 - 15.0 g/dL   HCT 78.2  95.6 - 21.3 %   MCV 93.6  78.0 - 100.0 fL   MCH 31.8  26.0 - 34.0 pg   MCHC 33.9  30.0 - 36.0 g/dL   RDW 08.6  57.8 - 46.9 %   Platelets 247  150 - 400 K/uL   Neutrophils Relative 57  43 - 77 %   Neutro Abs 4.2  1.7 - 7.7 K/uL   Lymphocytes Relative 31  12 - 46 %   Lymphs Abs 2.3  0.7 - 4.0 K/uL   Monocytes Relative 9  3 - 12 %   Monocytes Absolute 0.7  0.1 - 1.0 K/uL   Eosinophils Relative 2  0 - 5 %   Eosinophils Absolute 0.1  0.0 - 0.7 K/uL   Basophils Relative 1  0 - 1 %   Basophils Absolute 0.1  0.0 - 0.1 K/uL  COMPREHENSIVE METABOLIC PANEL     Status: Abnormal   Collection Time   03/15/12 12:36 PM      Component Value Range   Sodium 136  135 - 145 mEq/L   Potassium 4.1  3.5 - 5.1 mEq/L   Chloride 99  96 - 112 mEq/L   CO2 25  19 - 32 mEq/L  Glucose, Bld 102 (*) 70 - 99 mg/dL   BUN 7  6 - 23 mg/dL   Creatinine, Ser 0.86  0.50 - 1.10 mg/dL   Calcium 57.8  8.4 - 46.9 mg/dL   Total Protein 7.6  6.0 - 8.3 g/dL   Albumin 4.0  3.5 - 5.2 g/dL   AST 16  0 - 37 U/L   ALT 26  0 - 35 U/L   Alkaline Phosphatase 101  39 - 117 U/L   Total Bilirubin 0.2 (*) 0.3 - 1.2 mg/dL   GFR calc non Af Amer >90  >90 mL/min   GFR calc Af Amer >90  >90 mL/min  POCT I-STAT TROPONIN I     Status: Normal   Collection Time   03/15/12  3:05 PM      Component Value Range   Troponin i, poc 0.01  0.00 - 0.08 ng/mL   Comment 3           CARDIAC PANEL(CRET KIN+CKTOT+MB+TROPI)     Status: Normal   Collection Time   03/15/12  6:10 PM      Component Value Range   Total CK 46  7 - 177 U/L   CK, MB 1.3  0.3 - 4.0 ng/mL   Troponin I <0.30  <0.30 ng/mL   Relative Index RELATIVE INDEX IS INVALID  0.0 - 2.5  HEMOGLOBIN A1C     Status: Normal   Collection Time   03/15/12  6:32 PM      Component Value Range   Hemoglobin A1C 5.2  <5.7 %   Mean Plasma Glucose 103  <117 mg/dL  MRSA PCR SCREENING     Status: Normal   Collection Time   03/15/12  8:54 PM       Component Value Range   MRSA by PCR NEGATIVE  NEGATIVE  CARDIAC PANEL(CRET KIN+CKTOT+MB+TROPI)     Status: Normal   Collection Time   03/16/12 12:05 AM      Component Value Range   Total CK 36  7 - 177 U/L   CK, MB 1.3  0.3 - 4.0 ng/mL   Troponin I <0.30  <0.30 ng/mL   Relative Index RELATIVE INDEX IS INVALID  0.0 - 2.5  CBC     Status: Normal   Collection Time   03/16/12  5:00 AM      Component Value Range   WBC 5.4  4.0 - 10.5 K/uL   RBC 4.34  3.87 - 5.11 MIL/uL   Hemoglobin 13.5  12.0 - 15.0 g/dL   HCT 62.9  52.8 - 41.3 %   MCV 93.1  78.0 - 100.0 fL   MCH 31.1  26.0 - 34.0 pg   MCHC 33.4  30.0 - 36.0 g/dL   RDW 24.4  01.0 - 27.2 %   Platelets 222  150 - 400 K/uL  LIPID PANEL     Status: Abnormal   Collection Time   03/16/12  5:00 AM      Component Value Range   Cholesterol 172  0 - 200 mg/dL   Triglycerides 536 (*) <150 mg/dL   HDL 26 (*) >64 mg/dL   Total CHOL/HDL Ratio 6.6     VLDL 53 (*) 0 - 40 mg/dL   LDL Cholesterol 93  0 - 99 mg/dL  CARDIAC PANEL(CRET KIN+CKTOT+MB+TROPI)     Status: Normal   Collection Time   03/16/12  5:51 AM      Component Value Range   Total CK 32  7 - 177 U/L   CK, MB 1.1  0.3 - 4.0 ng/mL   Troponin I <0.30  <0.30 ng/mL   Relative Index RELATIVE INDEX IS INVALID  0.0 - 2.5    Time spent on discharge: 25 minutes   Signed by:  Dorthula Rue. Earlene Plater, MD PGY-I, Internal Medicine  03/16/2012, 10:22 AM

## 2012-03-16 NOTE — H&P (Signed)
Internal Medicine Attending Admission Note Date: 03/16/2012  Patient name: Arisbeth Purrington Medical record number: [id number] Date of birth: 05-Dec-1965 Age: 46 y.o. Gender: female  I saw and evaluated the patient. I reviewed the resident's note and I agree with the resident's findings and plan as documented in the resident's note.  Chief Complaint(s): Chest pain.  History - key components related to admission:  Ms. Swords is a 46 year old woman with a history of hypertension, tobacco abuse, gastroesophageal reflux disease, anxiety, and depression who presents with the onset of a left upper chest ache that began while riding in a car on the morning of admission. The chest pain was described as an ache that radiated to the left jaw, left arm, and back. It was not associated with shortness of breath, nausea, diaphoresis, or dizziness. She took 4 baby aspirin and the pain was decreased although not resolved. As the pain was persistent and worse than usual she presented to the emergency department where she was given a nitroglycerin and had almost immediate relief of the pain.   Of note, she has had similar chest pains intermittently over the last year. These pains are not as intense or as persistent as the pain she experienced on the morning of admission. There is nothing that seems to exacerbate these pains nor is there anything that seems to relieve them. They are not associated with any particular activity or with any particular position or food. Since admission to the hospital she has had no further episodes of pain after she received nitroglycerin in the emergency department. She is without acute complaints at this time.  Physical Exam - key components related to admission:  Filed Vitals:   03/15/12 1815 03/15/12 1900 03/15/12 2037 03/16/12 0550  BP: 107/79 114/57 112/73 127/78  Pulse: 87 84 82 72  Temp:   97.8 F (36.6 C) 98.4 F (36.9 C)  TempSrc:   Oral Oral  Resp: 16 21 18 18   Height:    5\' 4"  (1.626 m)   Weight:   192 lb 6.4 oz (87.272 kg)   SpO2: 99% 99% 96% 98%   General: Well-developed, well-nourished, woman who appears older than her stated age lying comfortably in bed in no acute distress. Lungs: Clear to auscultation bilaterally without wheezes, rhonchi, or rales. Heart: Regular rate and rhythm without murmurs, rubs, or gallops. Abdomen: Soft, nontender, active bowel sounds without masses. Extremities: Without edema.  Lab results:  Basic Metabolic Panel:  Basename 03/15/12 1236  NA 136  K 4.1  CL 99  CO2 25  GLUCOSE 102*  BUN 7  CREATININE 0.72  CALCIUM 10.1  MG --  PHOS --   Liver Function Tests:  Basename 03/15/12 1236  AST 16  ALT 26  ALKPHOS 101  BILITOT 0.2*  PROT 7.6  ALBUMIN 4.0   CBC:  Basename 03/16/12 0500 03/15/12 1236  WBC 5.4 7.3  NEUTROABS -- 4.2  HGB 13.5 15.0  HCT 40.4 44.2  MCV 93.1 93.6  PLT 222 247   Cardiac Enzymes:  Basename 03/16/12 0551 03/16/12 0005 03/15/12 1810  CKTOTAL 32 36 46  CKMB 1.1 1.3 1.3  CKMBINDEX -- -- --  TROPONINI <0.30 <0.30 <0.30   Hemoglobin A1C:  Basename 03/15/12 1832  HGBA1C 5.2   Fasting Lipid Panel:  Basename 03/16/12 0500  CHOL 172  HDL 26*  LDLCALC 93  TRIG 409*  CHOLHDL 6.6  LDLDIRECT --   Imaging results:  Dg Chest 2 View  03/15/2012  *RADIOLOGY REPORT*  Clinical  Data: Chest pain.  COPD.  CHEST - 2 VIEW  Comparison: None.  Findings: Lungs are clear.  Heart size is normal.  No pneumothorax or effusion.  Degenerative change thoracic spine noted.  IMPRESSION: No acute finding.  Original Report Authenticated By: Bernadene Bell. Maricela Curet, M.D.   CXR with nipple markers: Resolution of the nodule seen on yesterday's film.  The former nodule would not have aligned with the nipple marker.  Other results:  EKG: Normal sinus rhythm at 67 beats per minute, normal axis, normal intervals, no significant Q waves, no LVH by voltage, good R wave progression, no ST-T changes.  Assessment  & Plan by Problem:  Ms. Forbush is a 46 year old woman with a history of hypertension, tobacco abuse, and family history of premature coronary artery disease who presents with the acute onset of chest pain. The pain is atypical in nature and has been present to a lesser degree over the last year. She has ruled out for a myocardial infarction with serial enzymes and EKGs. Nonetheless, given her risk factors, she deserves further evaluation for evidence of reversible ischemia. Given her persistence of symptoms over the past year and current clinical stability this further evaluation can be done in the outpatient setting. The differential diagnosis also includes reflux disease with esophageal spasm given her response to the nitroglycerin. She may also have musculoskeletal pain although the physical exam did not support this diagnosis.  1) Chest pain: Ms. Quade deserves further risk stratification in the outpatient setting. We therefore recommend that she undergo a nuclear medicine stress test set up through her primary care provider. With her back pain and poor exercise tolerance she would not likely reach target heart rate with an exercise EKG. In the interim we will start Ms. Naeem on aspirin 81 mg by mouth daily. She was counseled on the importance of smoking cessation for her health moving forward, particularly with her family history of premature coronary artery disease.  2) Disposition: I agree with the housestaff's plan to discharge Ms. Sun home today with followup next week in her primary care provider's office where a nuclear medicine stress test can be arranged to rule out any reversible ischemia.

## 2012-03-19 NOTE — ED Provider Notes (Signed)
Medical screening examination/treatment/procedure(s) were conducted as a shared visit with non-physician practitioner(s) and myself.  I personally evaluated the patient during the encounter  Patient with some typical symptoms and some atypical symptoms of acute coronary syndrome .  Patient be admitted for additional evaluation  Lyanne Co, MD 03/19/12 [phone]

## 2012-03-20 ENCOUNTER — Encounter: Payer: Self-pay | Admitting: Cardiovascular Disease

## 2012-03-20 ENCOUNTER — Ambulatory Visit (INDEPENDENT_AMBULATORY_CARE_PROVIDER_SITE_OTHER): Payer: Medicaid Other | Admitting: Cardiovascular Disease

## 2012-03-20 VITALS — BP 112/82 | HR 103 | Ht 64.0 in | Wt 191.5 lb

## 2012-03-20 DIAGNOSIS — R0602 Shortness of breath: Secondary | ICD-10-CM

## 2012-03-20 DIAGNOSIS — R079 Chest pain, unspecified: Secondary | ICD-10-CM

## 2012-03-20 DIAGNOSIS — R0789 Other chest pain: Secondary | ICD-10-CM | POA: Insufficient documentation

## 2012-03-20 NOTE — Progress Notes (Signed)
Vennessa Neale Burly Date of Birth  12/26/1965       [facility]    [place] [address], [address]  [address], [address] [place], [place]  [address]   [place], [place]  [address] [phone]     [phone]   Fax  [phone]    Fax [phone]  Problem List: 1. Chest pain- hospitalized at [facility] - r/o for MI.  Scheduled for OP evaluation  History of Present Illness:  Mililani is a 46 yo who was admitted recently to [facility] for episodes of CP.  She r/o for MI.  She had recurrent CP on Saturday night ( lasted 30 minutes). The pain was described as a heaviness.  She had a similar episode of CP on Monday .  She took NTG and the pain was eventually relieved after 5 minutes.  She has a strong family hx of CAD on her father's side.  MIs at early age.    She does not get any regular exercise but does not have any CP with normal activities .  She smokes about 2 packs of cigarettes a day. She drinks between 2 and 4 L of diet Pepsi everyday. She's able to work out in a garden but she insists that she is not able to maintain a job. She is looking forward to  a disability hearing later this year.  Current Outpatient Prescriptions on File Prior to Visit  Medication Sig Dispense Refill  . albuterol (PROVENTIL HFA;VENTOLIN HFA) 108 (90 BASE) MCG/ACT inhaler Inhale 2 puffs into the lungs as needed.      Marland Kitchen aspirin EC 81 MG EC tablet Take 1 tablet (81 mg total) by mouth daily.      . beclomethasone (QVAR) 80 MCG/ACT inhaler Inhale 1 puff into the lungs every 12 (twelve) hours.      . clonazePAM (KLONOPIN) 1 MG tablet Take 1 mg by mouth 3 (three) times daily as needed. For anxiety      . DULoxetine (CYMBALTA) 60 MG capsule Take 60 mg by mouth at bedtime.      . Fluticasone Propionate (FLONASE NA) Place into the nose as needed.      Marland Kitchen lisinopril (PRINIVIL,ZESTRIL) 20 MG tablet Take 20 mg by mouth at bedtime.      . montelukast (SINGULAIR) 10 MG tablet Take 10 mg by  mouth at bedtime.      . nitroGLYCERIN (NITROSTAT) 0.4 MG SL tablet Place 1 tablet (0.4 mg total) under the tongue every 5 (five) minutes x 3 doses as needed for chest pain (Seek medical attention if pain persists.).  30 tablet  0  . omeprazole (PRILOSEC) 20 MG capsule Take 20 mg by mouth 2 (two) times daily.      . pregabalin (LYRICA) 100 MG capsule Take 100 mg by mouth 2 (two) times daily.      Marland Kitchen venlafaxine XR (EFFEXOR-XR) 75 MG 24 hr capsule Take 225 mg by mouth at bedtime.        Allergies  Allergen Reactions  . Prednisone Other (See Comments)    "psychological reaction"  . Shellfish Allergy Nausea And Vomiting and Swelling    Throat closes, etc.  . Silver Nitrate Other (See Comments)    Blisters, skin burning  . Codeine      hives  . Dilaudid (Hydromorphone Hcl) Other (See Comments)    migraine    Past Medical History  Diagnosis Date  . Hypertension   . Acid reflux   .  Depression   . Ovarian cyst   . DJD (degenerative joint disease), lumbar   . Tobacco abuse   . Anxiety   . Asthma   . Dysplasia of cervix, unspecified   . Allergic rhinitis, cause unspecified   . Insomnia, unspecified   . Degeneration of intervertebral disc, site unspecified   . Esophageal reflux   . Hirsutism   . Uterovaginal prolapse, incomplete   . Unspecified episodic mood disorder   . Other and unspecified ovarian cyst   . Unspecified hypertrophic and atrophic condition of skin   . Cancer     skin    Past Surgical History  Procedure Date  . Cholecystectomy   . Abdominal hysterectomy   . Bilateral tubal ligation     History  Smoking status  . Current Everyday Smoker -- 2.0 packs/day for 32 years  . Types: Cigarettes  Smokeless tobacco  . Not on file    History  Alcohol Use  . Yes    3-4 mixed drinks 1-2 times per week    Family History  Problem Relation Age of Onset  . Breast cancer Sister   . Uterine cancer Mother   . Breast cancer Maternal Grandmother   . Uterine cancer  Maternal Grandmother   . Hypertension    . Diabetes type II    . Heart attack Father 45  . Heart attack Paternal Grandmother 45  . Heart attack Maternal Aunt     Reviw of Systems:  Reviewed in the HPI.  All other systems are negative.  Physical Exam: Blood pressure 112/82, height 5\' 4"  (1.626 m), weight 191 lb 8 oz (86.864 kg). General: Well developed, well nourished, in no acute distress.  Head: Normocephalic, atraumatic, sclera non-icteric, mucus membranes are moist,   Neck: Supple. Carotids are 2 + without bruits. No JVD  Lungs: Clear bilaterally to auscultation.  Heart: regular rate.  normal  S1 S2. No murmurs, gallops or rubs.  Abdomen: Soft, non-tender, non-distended with normal bowel sounds. No hepatomegaly. No rebound/guarding. No masses.  Msk:  Strength and tone are normal  Extremities: No clubbing or cyanosis. No edema.  Distal pedal pulses are 2+ and equal bilaterally.  Neuro: Alert and oriented X 3. Moves all extremities spontaneously.  Psych:  Responds to questions appropriately with a normal affect.  ECG: March 20, 2012 --Sinus tach at 103.  No ST or T wave changes.  Assessment / Plan:

## 2012-03-20 NOTE — Patient Instructions (Addendum)
Your physician has requested that you have a lexiscan myoview. For further information please visit [email or web address] Please follow instruction sheet, as given.   Your physician wants you to follow-up in: as needed. You will receive a reminder letter in the mail two months in advance. If you don't receive a letter, please call our office to schedule the follow-up appointment.

## 2012-03-20 NOTE — Assessment & Plan Note (Signed)
Ana Butler presents with some episodes of chest pressure. She does have a family history of cardiac disease and is a heavy smoker. We will schedule her for a Lexiscan Myoview study for further evaluation. If the Lexiscan study is normal then we will refer her back to her general medical Dr. and will not schedule her back for a regularly scheduled appointment. If he Lexus scan study is abnormal then we will need to see her back for cardiac catheterization.  She has an allergy to shellfish and she will need to be premedicated. 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 We discussed the fact that going directly to cardiac catheterization was somewhat risky since she seems to react to several various things that we would need to give  her.

## 2012-03-21 ENCOUNTER — Ambulatory Visit: Payer: Medicaid Other | Admitting: Cardiovascular Disease

## 2012-03-22 ENCOUNTER — Ambulatory Visit: Payer: Self-pay

## 2012-03-22 DIAGNOSIS — R079 Chest pain, unspecified: Secondary | ICD-10-CM

## 2012-03-25 ENCOUNTER — Telehealth: Payer: Self-pay

## 2012-03-25 ENCOUNTER — Other Ambulatory Visit: Payer: Self-pay | Admitting: Cardiovascular Disease

## 2012-03-25 DIAGNOSIS — R0602 Shortness of breath: Secondary | ICD-10-CM

## 2012-03-25 DIAGNOSIS — R079 Chest pain, unspecified: Secondary | ICD-10-CM

## 2012-03-25 NOTE — Telephone Encounter (Signed)
Pt calling for lexiscan results. Prelim results given to pt. Normal scan Understanding verb.

## 2012-05-14 ENCOUNTER — Observation Stay (HOSPITAL_COMMUNITY)
Admission: EM | Admit: 2012-05-14 | Discharge: 2012-05-14 | Disposition: A | Discharge status: Home / Self Care | Payer: Medicaid Other | Attending: Emergency Medicine | Admitting: Emergency Medicine

## 2012-05-14 ENCOUNTER — Encounter (HOSPITAL_COMMUNITY): Payer: Self-pay | Admitting: Emergency Medicine

## 2012-05-14 DIAGNOSIS — G8929 Other chronic pain: Principal | ICD-10-CM

## 2012-05-14 DIAGNOSIS — M5416 Radiculopathy, lumbar region: Secondary | ICD-10-CM

## 2012-05-14 DIAGNOSIS — M79609 Pain in unspecified limb: Secondary | ICD-10-CM | POA: Insufficient documentation

## 2012-05-14 DIAGNOSIS — I10 Essential (primary) hypertension: Secondary | ICD-10-CM | POA: Insufficient documentation

## 2012-05-14 DIAGNOSIS — M549 Dorsalgia, unspecified: Secondary | ICD-10-CM | POA: Insufficient documentation

## 2012-05-14 DIAGNOSIS — IMO0002 Reserved for concepts with insufficient information to code with codable children: Secondary | ICD-10-CM | POA: Insufficient documentation

## 2012-05-14 DIAGNOSIS — M5430 Sciatica, unspecified side: Secondary | ICD-10-CM

## 2012-05-14 LAB — URINALYSIS, ROUTINE W REFLEX MICROSCOPIC
Glucose, UA: NEGATIVE mg/dL
Hgb urine dipstick: NEGATIVE
Protein, ur: NEGATIVE mg/dL

## 2012-05-14 MED ORDER — MORPHINE SULFATE 10 MG/ML IJ SOLN
10.0000 mg | Freq: Once | INTRAMUSCULAR | Status: AC
  Administered 2012-05-14: 10 mg via INTRAMUSCULAR
  Filled 2012-05-14: qty 1

## 2012-05-14 MED ORDER — DIAZEPAM 5 MG PO TABS: 5.0000 mg | ORAL_TABLET | Freq: Two times a day (BID) | ORAL | Status: DC

## 2012-05-14 MED ORDER — DIAZEPAM 5 MG/ML IJ SOLN: 5.0000 mg | Freq: Four times a day (QID) | INTRAMUSCULAR | Status: DC | PRN

## 2012-05-14 MED ORDER — NICOTINE 21 MG/24HR TD PT24
21.0000 mg | MEDICATED_PATCH | Freq: Once | TRANSDERMAL | Status: DC
  Administered 2012-05-14: 21 mg via TRANSDERMAL
  Filled 2012-05-14: qty 1

## 2012-05-14 MED ORDER — DIAZEPAM 5 MG PO TABS
10.0000 mg | ORAL_TABLET | Freq: Once | ORAL | Status: AC
  Administered 2012-05-14: 10 mg via ORAL
  Filled 2012-05-14: qty 2

## 2012-05-14 MED ORDER — HYDROMORPHONE HCL 4 MG PO TABS: 4.0000 mg | ORAL_TABLET | ORAL | Status: DC | PRN

## 2012-05-14 MED ORDER — KETOROLAC TROMETHAMINE 30 MG/ML IJ SOLN
30.0000 mg | Freq: Four times a day (QID) | INTRAMUSCULAR | Status: DC | PRN
  Administered 2012-05-14: 30 mg via INTRAVENOUS
  Filled 2012-05-14 (×2): qty 1

## 2012-05-14 MED ORDER — MORPHINE SULFATE 4 MG/ML IJ SOLN
4.0000 mg | INTRAMUSCULAR | Status: DC | PRN
  Administered 2012-05-14: 4 mg via INTRAVENOUS
  Filled 2012-05-14: qty 1

## 2012-05-14 NOTE — ED Notes (Signed)
MD at bedside. 

## 2012-05-14 NOTE — ED Provider Notes (Signed)
Patient sent to CDU under observation, back pain protocol.  This is a shared visit with Dr Oletta Lamas.  Sign out received from Dr Oletta Lamas.  Pt with known chronic lower back pain with right lower extremity radiculopathy.  Sees Dr Gerlene Fee for this and saw him just 4 days ago.  Pt is waiting for medicaid to approve repeat MRI at this time.  Pt has had pain exacerbation that began 3 days ago without injury.  Per Dr Oletta Lamas, there are no indications for emergent MRI at this time.  Patient is currently (1:45pm) complaining of 7-8/10 pain, though she is falling asleep sitting up and having difficulty keeping her eyes open while speaking.  Per tech, Malachi Bonds, patient was unable to give urine sample and Ida performed in and out cath, resulting in 700cc of urine output.  I have discussed this with Dr Oletta Lamas.  It is unclear if patient is having true urinary retention or if the pain medication is making her less able to urinate on command.    Per Dr Oletta Lamas, pt previously denied any urinary retention, had urinated just prior to his seeing her.  Plan is to continue patient on back pain protocol, allow patient to wake up a bit and the medication to wear off somewhat and to reassess.  3:50 PM Pt discussed with Felicie Morn, NP, who assumes care of patient at change of shift.       [place], [place] 05/14/12 1551

## 2012-05-14 NOTE — ED Notes (Signed)
Pt has history of rt sciatica pain and reports pain for 7 days without relief. Pt alert oriented X4 pain controlled.

## 2012-05-14 NOTE — ED Provider Notes (Signed)
Medical screening examination/treatment/procedure(s) were conducted as a shared visit with non-physician practitioner(s) and myself.  I personally evaluated the patient during the encounter  Please see my original H&P for initial encounter and evaluation.  Gavin Pound. Aiko Belko, MD 05/14/12 5621

## 2012-05-14 NOTE — ED Notes (Signed)
Pt was helped onto bedpan.  Unable to urinate on bedpan.  In/out cath done and output.  Pt denies pain with urination.  States difficulty urinating since sat

## 2012-05-14 NOTE — ED Provider Notes (Signed)
History  This chart was scribed for Gavin Pound. Oletta Lamas, MD by Ladona Ridgel Day. This patient was seen in room TR09C/TR09C and the patient's care was started at 1042.   CSN: [id number]  Arrival date & time 05/14/12  1042   None     Chief Complaint  Patient presents with  . Leg Pain   The history is provided by the patient. No language interpreter was used.   Ana Butler is a 46 y.o. female who presents to the Emergency Department with chronic back problems complaining of exacerbated symptoms of her sciatica which is worse in her right leg. She states currently waiting to receive Medicaid so she can continue workup for her back problems with Dr. Hali Marry who is planning to have an MRI soon. She states some incontinence. She is allergic to codeine, and dilaudid.  Past Medical History  Diagnosis Date  . Hypertension   . Acid reflux   . Depression   . Ovarian cyst   . DJD (degenerative joint disease), lumbar   . Tobacco abuse   . Anxiety   . Asthma   . Dysplasia of cervix, unspecified   . Allergic rhinitis, cause unspecified   . Insomnia, unspecified   . Degeneration of intervertebral disc, site unspecified   . Esophageal reflux   . Hirsutism   . Uterovaginal prolapse, incomplete   . Unspecified episodic mood disorder   . Other and unspecified ovarian cyst   . Unspecified hypertrophic and atrophic condition of skin   . Cancer     skin    Past Surgical History  Procedure Date  . Cholecystectomy   . Abdominal hysterectomy   . Bilateral tubal ligation     Family History  Problem Relation Age of Onset  . Breast cancer Sister   . Uterine cancer Mother   . Breast cancer Maternal Grandmother   . Uterine cancer Maternal Grandmother   . Hypertension    . Diabetes type II    . Heart attack Father 70  . Heart attack Paternal Grandmother 45  . Heart attack Maternal Aunt     History  Substance Use Topics  . Smoking status: Current Every Day Smoker -- 2.0 packs/day for 32 years      Types: Cigarettes  . Smokeless tobacco: Not on file  . Alcohol Use: Yes     3-4 mixed drinks 1-2 times per week    OB History    Grav Para Term Preterm Abortions TAB SAB Ect Mult Living                  Review of Systems  Constitutional: Negative for fever and chills.  Respiratory: Negative for shortness of breath.   Gastrointestinal: Negative for nausea and vomiting.  Genitourinary: Negative for dysuria and flank pain.       Reported chronic incontinence, no change from chronic, doesn't use pads  Musculoskeletal: Positive for back pain and arthralgias.       Right leg pain associated with her sciatica  Neurological: Negative for weakness and numbness.  All other systems reviewed and are negative.    Allergies  Prednisone; Shellfish allergy; Silver nitrate; Codeine; Dilaudid; and Vicodin  Home Medications   Current Outpatient Rx  Name Route Sig Dispense Refill  . ALBUTEROL SULFATE HFA 108 (90 BASE) MCG/ACT IN AERS Inhalation Inhale 2 puffs into the lungs daily as needed. For wheezing.    . ASPIRIN EC 81 MG PO TBEC Oral Take 81 mg by mouth daily.    Marland Kitchen  BECLOMETHASONE DIPROPIONATE 80 MCG/ACT IN AERS Inhalation Inhale 1 puff into the lungs every 12 (twelve) hours.    Marland Kitchen CLONAZEPAM 1 MG PO TABS Oral Take 1 mg by mouth 3 (three) times daily as needed. For anxiety    . DICLOFENAC SODIUM 1 % TD GEL Topical Apply 4 g topically 4 (four) times daily as needed. For pain.    Marland Kitchen DIPHENHYDRAMINE HCL 25 MG PO CAPS Oral Take 25 mg by mouth every 6 (six) hours as needed. For sleep.    . DULOXETINE HCL 60 MG PO CPEP Oral Take 60 mg by mouth at bedtime.    Marland Kitchen FLONASE NA Nasal Place 1 spray into the nose daily as needed. For congestion.    Marland Kitchen LISINOPRIL 20 MG PO TABS Oral Take 20 mg by mouth at bedtime.    Marland Kitchen MONTELUKAST SODIUM 10 MG PO TABS Oral Take 10 mg by mouth daily as needed. For allergies.    Marland Kitchen NICOTINE 21 MG/24HR TD PT24 Transdermal Place 1 patch onto the skin daily.    Marland Kitchen NITROGLYCERIN  0.4 MG SL SUBL Sublingual Place 0.4 mg under the tongue every 5 (five) minutes as needed. For chest pain.    Marland Kitchen OMEPRAZOLE 20 MG PO CPDR Oral Take 20 mg by mouth 2 (two) times daily.    . OXYCODONE-ACETAMINOPHEN 10-325 MG PO TABS Oral Take 1 tablet by mouth 6 (six) times daily.     Marland Kitchen PREGABALIN 100 MG PO CAPS Oral Take 100 mg by mouth 3 (three) times daily.     . VENLAFAXINE HCL ER 75 MG PO CP24 Oral Take 225 mg by mouth at bedtime.      Triage Vitals: BP 112/79  Pulse 91  Temp 98 F (36.7 C) (Oral)  Resp 16  SpO2 95%  Physical Exam  Nursing note and vitals reviewed. Constitutional: She is oriented to person, place, and time. She appears well-developed and well-nourished. No distress.  HENT:  Head: Normocephalic and atraumatic.  Eyes: EOM are normal.  Neck: Neck supple. No tracheal deviation present.  Cardiovascular: Normal rate.   Pulmonary/Chest: Effort normal. No respiratory distress.  Abdominal: Soft. She exhibits no distension.  Musculoskeletal: Normal range of motion. She exhibits no edema.       Lumbar back: She exhibits tenderness, pain and spasm. She exhibits no bony tenderness, no swelling and normal pulse.       Bilateral lumbar muscles spasms.      Neurological: She is alert and oriented to person, place, and time. She displays normal reflexes. No sensory deficit. She exhibits normal muscle tone. GCS eye subscore is 4. GCS verbal subscore is 5. GCS motor subscore is 6.  Reflex Scores:      Patellar reflexes are 2+ on the right side and 2+ on the left side.      2+ patellar reflexes. 3/5 strength BLE limited by pain and decreased effort.  Pt has intact spontaneous movements and will shift herself in the bed while laying  Skin: Skin is warm and dry. No rash noted. She is not diaphoretic. No pallor.       No rash  Psychiatric: Her behavior is normal. Thought content normal. Her mood appears anxious. Her affect is not angry. Her speech is not rapid and/or pressured. She  expresses impulsivity. She does not express inappropriate judgment. She does not exhibit a depressed mood.       tearful    ED Course  Procedures (including critical care time) DIAGNOSTIC STUDIES: Oxygen Saturation  is 95% on room air, adequate by my interpretation.    COORDINATION OF CARE: At 1120 AM Discussed treatment plan with patient which includes morphine, valium, and discuss her workup with Dr. Gerlene Fee. Patient agrees.   Labs Reviewed - No data to display No results found.   1. Chronic back pain   2. Radiculopathy of lumbar region   3. Sciatica     Will place in CDU under back protocol.  Pt is trying to get a new MRI and to have surgery done by Dr. Gerlene Fee as her understanding.  She reports waiting on Medicaid to approve it.  No fever here, no change in urinary habits, she reports she urinated 30 minutes prior to arrival.  No rash.  No trauma.  Pain seems to be muscular spasms here and exacerbation of chronic pain.  Will discuss with Dr. Gerlene Fee as far as obtaining non emergent MRI here.      11:53AM Spoke to Dr. Gerlene Fee who agreed that pain control was main issue, MRI has not yet been approved.  Will move to CDU for pain control.  Pt complained of urinary hesitancy with PAC.  Will allow some clearing of narcotic medication, allow pt to try to urinate on her own again and consider bladder scan or post void residual afterwards.      MDM  I personally performed the services described in this documentation, which was scribed in my presence. The recorded information has been reviewed and considered.          Gavin Pound. Elwyn Klosinski, MD 05/14/12 1411

## 2012-05-14 NOTE — ED Provider Notes (Signed)
Patient moved to the CDU under the back pain protocol.  Recurrent bouts of chronic back pain, sees Dr. Gerlene Fee.  Patient reporting today difficulty voiding.  Patient initially required I/O cath, with about 700 cc obtained.  At last check, patient voided 150 cc on her own, with bladder scan revealing no residual.  No indication for emergent MRI.  Patient's pain improved with medication, although does recur.  Limited options for change in home pain management d/t documented allergies. Patient to follow-up with her PCP and neurosurgery.  Jimmye Norman, NP 05/14/12 [phone]

## 2012-05-14 NOTE — ED Notes (Signed)
Pt c/o right leg pain from sciatica x 1 week; pt sts hx of same

## 2012-05-15 NOTE — ED Provider Notes (Signed)
Medical screening examination/treatment/procedure(s) were conducted as a shared visit with non-physician practitioner(s) and myself.  I personally evaluated the patient during the encounter   Please see my original note for initial evaluation and ED course.  Ana Butler. Rondal Vandevelde, MD 05/15/12 1352

## 2012-05-20 ENCOUNTER — Emergency Department: Payer: Self-pay | Admitting: Internal Medicine

## 2012-05-27 ENCOUNTER — Other Ambulatory Visit: Payer: Self-pay | Admitting: Neurosurgery

## 2012-05-27 DIAGNOSIS — IMO0002 Reserved for concepts with insufficient information to code with codable children: Secondary | ICD-10-CM

## 2012-06-03 ENCOUNTER — Ambulatory Visit
Admission: RE | Admit: 2012-06-03 | Discharge: 2012-06-03 | Disposition: A | Discharge status: Home / Self Care | Payer: Medicaid Other | Source: Ambulatory Visit | Attending: Neurosurgery | Admitting: Neurosurgery

## 2012-06-03 ENCOUNTER — Other Ambulatory Visit: Payer: Self-pay | Admitting: Neurosurgery

## 2012-06-03 DIAGNOSIS — IMO0002 Reserved for concepts with insufficient information to code with codable children: Secondary | ICD-10-CM

## 2012-06-04 ENCOUNTER — Encounter (HOSPITAL_COMMUNITY): Payer: Self-pay | Admitting: Pharmacy Technician

## 2012-06-06 ENCOUNTER — Encounter (HOSPITAL_COMMUNITY): Payer: Self-pay

## 2012-06-06 ENCOUNTER — Encounter (HOSPITAL_COMMUNITY)
Admission: RE | Admit: 2012-06-06 | Discharge: 2012-06-06 | Disposition: A | Discharge status: Home / Self Care | Payer: Medicaid Other | Source: Ambulatory Visit | Attending: Neurosurgery | Admitting: Neurosurgery

## 2012-06-06 LAB — BASIC METABOLIC PANEL
BUN: 11 mg/dL (ref 6–23)
Chloride: 103 mEq/L (ref 96–112)
GFR calc Af Amer: >90 mL/min (ref >90)
Potassium: 4.7 mEq/L (ref 3.5–5.1)

## 2012-06-06 LAB — SURGICAL PCR SCREEN: MRSA, PCR: NEGATIVE

## 2012-06-06 LAB — CBC
HCT: 42.1 % (ref 36.0–46.0)
Hemoglobin: 14.2 g/dL (ref 12.0–15.0)
MCH: 31.6 pg (ref 26.0–34.0)
MCV: 93.8 fL (ref 78.0–100.0)
Platelets: 252 10*3/uL (ref 150–400)
RDW: 13.7 % (ref 11.5–15.5)
WBC: 6 10*3/uL (ref 4.0–10.5)

## 2012-06-06 NOTE — Pre-Procedure Instructions (Signed)
[address] Tira Delmonico  06/06/2012   Your procedure is scheduled on:  06/11/12  Report to [facility] at 1230 pm  Call this number if you have problems the morning of surgery: [phone]   Remember:   Do not eat food:After Midnight.   Take these medicines the morning of surgery with A SIP OF WATER: all inhalers,cymbalta,toradol,prilosec,effexor,percocet,ntg if needed   Do not wear jewelry, make-up or nail polish.  Do not wear lotions, powders, or perfumes. You may wear deodorant.  Do not shave 48 hours prior to surgery. Men may shave face and neck.  Do not bring valuables to the hospital.  Contacts, dentures or bridgework may not be worn into surgery.  Leave suitcase in the car. After surgery it may be brought to your room.  For patients admitted to the hospital, checkout time is 11:00 AM the day of discharge.   Patients discharged the day of surgery will not be allowed to drive home.  Name and phone number of your driver: family  Special Instructions: Shower using CHG 2 nights before surgery and the night before surgery.  If you shower the day of surgery use CHG.  Use special wash - you have one bottle of CHG for all showers.  You should use approximately 1/3 of the bottle for each shower.   Please read over the following fact sheets that you were given: Pain Booklet, Coughing and Deep Breathing, MRSA Information and Surgical Site Infection Prevention

## 2012-06-06 NOTE — Progress Notes (Signed)
Will give ekg to Allison for review. 

## 2012-06-07 NOTE — Consult Note (Signed)
Anesthesia chart review: Patient is a 46 year old female scheduled for right L5-S1 microdiscectomy on 06/11/2012 by Dr. Gerlene Fee. History includes smoking, obesity, depression, anxiety, insomnia, hypertension, asthma, hirsutism, Marian cyst, hysterectomy with postoperative ileus, esophageal reflux, skin cancer.  EKG on 03/16/2012 showed normal sinus rhythm with low voltage QRS. EKG on 03/20/2012 showed ST @ 103 bpm with RSR (V1).  Patient had a normal stress Myoview at [facility] on 03/22/12.  Normal myocardial perfusion with some mildly decreased perfusion in the apical wall but this stress and rest consistent with attenuation artifact,  EF 59%, no significant wall motion abnormalities.  Chest x-ray on 03/16/2012 showed no active cardiopulmonary disease.  Labs noted.  Anticipate she can proceed as planned.  Shonna Chock, PA-C

## 2012-06-10 MED ORDER — CEFAZOLIN SODIUM-DEXTROSE 2-3 GM-% IV SOLR
2.0000 g | INTRAVENOUS | Status: AC
  Administered 2012-06-11: 2 g via INTRAVENOUS
  Filled 2012-06-10: qty 50

## 2012-06-10 NOTE — Progress Notes (Signed)
Pt notified of time change;to arrive at 0745 

## 2012-06-11 ENCOUNTER — Ambulatory Visit (HOSPITAL_COMMUNITY): Payer: Medicaid Other | Admitting: Vascular Surgery

## 2012-06-11 ENCOUNTER — Inpatient Hospital Stay (HOSPITAL_COMMUNITY)
Admission: RE | Admit: 2012-06-11 | Discharge: 2012-06-11 | DRG: 491 | Disposition: A | Discharge status: Home / Self Care | Payer: Medicaid Other | Source: Ambulatory Visit | Attending: Neurosurgery | Admitting: Neurosurgery

## 2012-06-11 ENCOUNTER — Encounter (HOSPITAL_COMMUNITY)
Admission: RE | Disposition: A | Discharge status: Home / Self Care | Payer: Self-pay | Source: Ambulatory Visit | Attending: Neurosurgery

## 2012-06-11 ENCOUNTER — Encounter (HOSPITAL_COMMUNITY): Payer: Self-pay | Admitting: Vascular Surgery

## 2012-06-11 ENCOUNTER — Encounter (HOSPITAL_COMMUNITY): Payer: Self-pay | Admitting: *Deleted

## 2012-06-11 ENCOUNTER — Ambulatory Visit (HOSPITAL_COMMUNITY): Payer: Medicaid Other

## 2012-06-11 DIAGNOSIS — M47817 Spondylosis without myelopathy or radiculopathy, lumbosacral region: Secondary | ICD-10-CM | POA: Diagnosis present

## 2012-06-11 DIAGNOSIS — J45909 Unspecified asthma, uncomplicated: Secondary | ICD-10-CM | POA: Diagnosis present

## 2012-06-11 DIAGNOSIS — Z888 Allergy status to other drugs, medicaments and biological substances status: Secondary | ICD-10-CM

## 2012-06-11 DIAGNOSIS — F39 Unspecified mood [affective] disorder: Secondary | ICD-10-CM | POA: Diagnosis present

## 2012-06-11 DIAGNOSIS — Z885 Allergy status to narcotic agent status: Secondary | ICD-10-CM

## 2012-06-11 DIAGNOSIS — I10 Essential (primary) hypertension: Secondary | ICD-10-CM | POA: Diagnosis present

## 2012-06-11 DIAGNOSIS — Z85828 Personal history of other malignant neoplasm of skin: Secondary | ICD-10-CM

## 2012-06-11 DIAGNOSIS — F411 Generalized anxiety disorder: Secondary | ICD-10-CM | POA: Diagnosis present

## 2012-06-11 DIAGNOSIS — L680 Hirsutism: Secondary | ICD-10-CM | POA: Diagnosis present

## 2012-06-11 DIAGNOSIS — Z01812 Encounter for preprocedural laboratory examination: Secondary | ICD-10-CM

## 2012-06-11 DIAGNOSIS — Z79899 Other long term (current) drug therapy: Secondary | ICD-10-CM

## 2012-06-11 DIAGNOSIS — M5126 Other intervertebral disc displacement, lumbar region: Principal | ICD-10-CM | POA: Diagnosis present

## 2012-06-11 DIAGNOSIS — M5116 Intervertebral disc disorders with radiculopathy, lumbar region: Secondary | ICD-10-CM

## 2012-06-11 DIAGNOSIS — Z8249 Family history of ischemic heart disease and other diseases of the circulatory system: Secondary | ICD-10-CM

## 2012-06-11 DIAGNOSIS — F17200 Nicotine dependence, unspecified, uncomplicated: Secondary | ICD-10-CM | POA: Diagnosis present

## 2012-06-11 DIAGNOSIS — K219 Gastro-esophageal reflux disease without esophagitis: Secondary | ICD-10-CM | POA: Diagnosis present

## 2012-06-11 HISTORY — PX: LUMBAR LAMINECTOMY/DECOMPRESSION MICRODISCECTOMY: SHX5026

## 2012-06-11 SURGERY — LUMBAR LAMINECTOMY/DECOMPRESSION MICRODISCECTOMY 1 LEVEL
Anesthesia: General | Site: Back | Laterality: Right | Wound class: Clean

## 2012-06-11 MED ORDER — LIDOCAINE HCL (CARDIAC) 20 MG/ML IV SOLN
INTRAVENOUS | Status: DC | PRN
  Administered 2012-06-11: 100 mg via INTRAVENOUS

## 2012-06-11 MED ORDER — DEXAMETHASONE 4 MG PO TABS: 4.0000 mg | ORAL_TABLET | Freq: Four times a day (QID) | ORAL | Status: DC

## 2012-06-11 MED ORDER — NITROGLYCERIN 0.4 MG SL SUBL: 0.4000 mg | SUBLINGUAL_TABLET | SUBLINGUAL | Status: DC | PRN

## 2012-06-11 MED ORDER — SODIUM CHLORIDE 0.9 % IJ SOLN: 3.0000 mL | Freq: Two times a day (BID) | INTRAMUSCULAR | Status: DC

## 2012-06-11 MED ORDER — KETOROLAC TROMETHAMINE 30 MG/ML IJ SOLN: 30.0000 mg | Freq: Four times a day (QID) | INTRAMUSCULAR | Status: DC

## 2012-06-11 MED ORDER — BUPIVACAINE HCL (PF) 0.5 % IJ SOLN
INTRAMUSCULAR | Status: DC | PRN
  Administered 2012-06-11: 20 mL

## 2012-06-11 MED ORDER — NEOSTIGMINE METHYLSULFATE 1 MG/ML IJ SOLN
INTRAMUSCULAR | Status: DC | PRN
  Administered 2012-06-11: 3 mg via INTRAVENOUS

## 2012-06-11 MED ORDER — MORPHINE SULFATE 2 MG/ML IJ SOLN: 1.0000 mg | INTRAMUSCULAR | Status: DC | PRN

## 2012-06-11 MED ORDER — ONDANSETRON HCL 4 MG/2ML IJ SOLN
INTRAMUSCULAR | Status: DC | PRN
  Administered 2012-06-11 (×2): 4 mg via INTRAVENOUS

## 2012-06-11 MED ORDER — KCL IN DEXTROSE-NACL 20-5-0.45 MEQ/L-%-% IV SOLN
80.0000 mL/h | INTRAVENOUS | Status: DC
  Filled 2012-06-11 (×2): qty 1000

## 2012-06-11 MED ORDER — KETOROLAC TROMETHAMINE 30 MG/ML IJ SOLN
INTRAMUSCULAR | Status: DC | PRN
  Administered 2012-06-11: 30 mg via INTRAVENOUS

## 2012-06-11 MED ORDER — VENLAFAXINE HCL ER 75 MG PO CP24
225.0000 mg | ORAL_CAPSULE | Freq: Every day | ORAL | Status: DC
  Filled 2012-06-11: qty 1

## 2012-06-11 MED ORDER — KCL IN DEXTROSE-NACL 20-5-0.45 MEQ/L-%-% IV SOLN
INTRAVENOUS | Status: AC
  Filled 2012-06-11: qty 1000

## 2012-06-11 MED ORDER — BACITRACIN ZINC 500 UNIT/GM EX OINT
TOPICAL_OINTMENT | CUTANEOUS | Status: DC | PRN
  Administered 2012-06-11: 1 via TOPICAL

## 2012-06-11 MED ORDER — FENTANYL CITRATE 0.05 MG/ML IJ SOLN
INTRAMUSCULAR | Status: DC | PRN
  Administered 2012-06-11: 100 ug via INTRAVENOUS
  Administered 2012-06-11 (×3): 50 ug via INTRAVENOUS

## 2012-06-11 MED ORDER — CEFAZOLIN SODIUM-DEXTROSE 2-3 GM-% IV SOLR
2.0000 g | Freq: Three times a day (TID) | INTRAVENOUS | Status: DC
  Administered 2012-06-11: 2 g via INTRAVENOUS
  Filled 2012-06-11 (×2): qty 50

## 2012-06-11 MED ORDER — DEXAMETHASONE SODIUM PHOSPHATE 4 MG/ML IJ SOLN
4.0000 mg | Freq: Four times a day (QID) | INTRAMUSCULAR | Status: DC
  Administered 2012-06-11: 4 mg via INTRAVENOUS
  Filled 2012-06-11: qty 1

## 2012-06-11 MED ORDER — ACETAMINOPHEN 325 MG PO TABS: 650.0000 mg | ORAL_TABLET | ORAL | Status: DC | PRN

## 2012-06-11 MED ORDER — PHENOL 1.4 % MT LIQD: 1.0000 | OROMUCOSAL | Status: DC | PRN

## 2012-06-11 MED ORDER — HYDROMORPHONE HCL PF 1 MG/ML IJ SOLN: 1.0000 mg | INTRAMUSCULAR | Status: DC | PRN

## 2012-06-11 MED ORDER — ROCURONIUM BROMIDE 100 MG/10ML IV SOLN
INTRAVENOUS | Status: DC | PRN
  Administered 2012-06-11: 50 mg via INTRAVENOUS

## 2012-06-11 MED ORDER — PREGABALIN 50 MG PO CAPS: 100.0000 mg | ORAL_CAPSULE | Freq: Three times a day (TID) | ORAL | Status: DC

## 2012-06-11 MED ORDER — ZOLPIDEM TARTRATE 5 MG PO TABS: 5.0000 mg | ORAL_TABLET | Freq: Every evening | ORAL | Status: DC | PRN

## 2012-06-11 MED ORDER — SODIUM CHLORIDE 0.9 % IR SOLN
Status: DC | PRN
  Administered 2012-06-11: 11:00:00

## 2012-06-11 MED ORDER — ACETAMINOPHEN 650 MG RE SUPP: 650.0000 mg | RECTAL | Status: DC | PRN

## 2012-06-11 MED ORDER — BACITRACIN 50000 UNITS IM SOLR
INTRAMUSCULAR | Status: AC
  Filled 2012-06-11: qty 1

## 2012-06-11 MED ORDER — SODIUM CHLORIDE 0.9 % IV SOLN: 250.0000 mL | INTRAVENOUS | Status: DC

## 2012-06-11 MED ORDER — EPHEDRINE SULFATE 50 MG/ML IJ SOLN
INTRAMUSCULAR | Status: DC | PRN
  Administered 2012-06-11 (×5): 5 mg via INTRAVENOUS
  Administered 2012-06-11: 10 mg via INTRAVENOUS
  Administered 2012-06-11: 5 mg via INTRAVENOUS

## 2012-06-11 MED ORDER — HEMOSTATIC AGENTS (NO CHARGE) OPTIME
TOPICAL | Status: DC | PRN
  Administered 2012-06-11: 1 via TOPICAL

## 2012-06-11 MED ORDER — MORPHINE SULFATE 10 MG/ML IJ SOLN
INTRAMUSCULAR | Status: AC
  Administered 2012-06-11: 2 mg
  Filled 2012-06-11: qty 1

## 2012-06-11 MED ORDER — 0.9 % SODIUM CHLORIDE (POUR BTL) OPTIME
TOPICAL | Status: DC | PRN
  Administered 2012-06-11: 1000 mL

## 2012-06-11 MED ORDER — GLYCOPYRROLATE 0.2 MG/ML IJ SOLN
INTRAMUSCULAR | Status: DC | PRN
  Administered 2012-06-11: .5 mg via INTRAVENOUS

## 2012-06-11 MED ORDER — LACTATED RINGERS IV SOLN
INTRAVENOUS | Status: DC | PRN
  Administered 2012-06-11 (×2): via INTRAVENOUS

## 2012-06-11 MED ORDER — LISINOPRIL 20 MG PO TABS
20.0000 mg | ORAL_TABLET | Freq: Every day | ORAL | Status: DC
  Filled 2012-06-11: qty 1

## 2012-06-11 MED ORDER — PANTOPRAZOLE SODIUM 40 MG PO TBEC: 40.0000 mg | DELAYED_RELEASE_TABLET | Freq: Every day | ORAL | Status: DC

## 2012-06-11 MED ORDER — SODIUM CHLORIDE 0.9 % IV SOLN
INTRAVENOUS | Status: AC
  Filled 2012-06-11: qty 500

## 2012-06-11 MED ORDER — SODIUM CHLORIDE 0.9 % IJ SOLN: 3.0000 mL | INTRAMUSCULAR | Status: DC | PRN

## 2012-06-11 MED ORDER — METOCLOPRAMIDE HCL 5 MG/ML IJ SOLN
10.0000 mg | Freq: Once | INTRAMUSCULAR | Status: AC | PRN
  Administered 2012-06-11: 10 mg via INTRAVENOUS

## 2012-06-11 MED ORDER — DULOXETINE HCL 30 MG PO CPEP
30.0000 mg | ORAL_CAPSULE | Freq: Every evening | ORAL | Status: DC
  Filled 2012-06-11: qty 1

## 2012-06-11 MED ORDER — PROPOFOL 10 MG/ML IV BOLUS
INTRAVENOUS | Status: DC | PRN
  Administered 2012-06-11: 200 mg via INTRAVENOUS

## 2012-06-11 MED ORDER — THROMBIN 5000 UNITS EX KIT
PACK | CUTANEOUS | Status: DC | PRN
  Administered 2012-06-11 (×2): 5000 [IU] via TOPICAL

## 2012-06-11 MED ORDER — ALBUTEROL SULFATE HFA 108 (90 BASE) MCG/ACT IN AERS
2.0000 | INHALATION_SPRAY | Freq: Every day | RESPIRATORY_TRACT | Status: DC | PRN
  Filled 2012-06-11: qty 6.7

## 2012-06-11 MED ORDER — MIDAZOLAM HCL 5 MG/5ML IJ SOLN
INTRAMUSCULAR | Status: DC | PRN
  Administered 2012-06-11: 2 mg via INTRAVENOUS

## 2012-06-11 MED ORDER — FLUTICASONE PROPIONATE HFA 44 MCG/ACT IN AERO
1.0000 | INHALATION_SPRAY | Freq: Two times a day (BID) | RESPIRATORY_TRACT | Status: DC
  Filled 2012-06-11: qty 10.6

## 2012-06-11 MED ORDER — ONDANSETRON HCL 4 MG/2ML IJ SOLN: 4.0000 mg | INTRAMUSCULAR | Status: DC | PRN

## 2012-06-11 MED ORDER — OXYCODONE-ACETAMINOPHEN 5-325 MG PO TABS
1.0000 | ORAL_TABLET | ORAL | Status: DC | PRN
  Administered 2012-06-11: 2 via ORAL
  Filled 2012-06-11: qty 2

## 2012-06-11 MED ORDER — MENTHOL 3 MG MT LOZG: 1.0000 | LOZENGE | OROMUCOSAL | Status: DC | PRN

## 2012-06-11 MED ORDER — CYCLOBENZAPRINE HCL 10 MG PO TABS: 10.0000 mg | ORAL_TABLET | Freq: Three times a day (TID) | ORAL | Status: DC | PRN

## 2012-06-11 SURGICAL SUPPLY — 42 items
BAG DECANTER FOR FLEXI CONT (MISCELLANEOUS) ×2 IMPLANT
BENZOIN TINCTURE PRP APPL 2/3 (GAUZE/BANDAGES/DRESSINGS) ×4 IMPLANT
BLADE SURG ROTATE 9660 (MISCELLANEOUS) ×2 IMPLANT
BRUSH SCRUB EZ PLAIN DRY (MISCELLANEOUS) ×2 IMPLANT
CANISTER SUCTION 2500CC (MISCELLANEOUS) ×2 IMPLANT
CLOTH BEACON ORANGE TIMEOUT ST (SAFETY) ×2 IMPLANT
CONT SPEC 4OZ CLIKSEAL STRL BL (MISCELLANEOUS) ×2 IMPLANT
DERMABOND ADVANCED (GAUZE/BANDAGES/DRESSINGS) ×1
DERMABOND ADVANCED .7 DNX12 (GAUZE/BANDAGES/DRESSINGS) ×1 IMPLANT
DRAPE LAPAROTOMY 100X72X124 (DRAPES) ×2 IMPLANT
DRAPE MICROSCOPE LEICA (MISCELLANEOUS) ×2 IMPLANT
DRAPE SURG 17X23 STRL (DRAPES) ×4 IMPLANT
DRESSING TELFA 8X3 (GAUZE/BANDAGES/DRESSINGS) ×2 IMPLANT
ELECT REM PT RETURN 9FT ADLT (ELECTROSURGICAL) ×2
ELECTRODE REM PT RTRN 9FT ADLT (ELECTROSURGICAL) ×1 IMPLANT
GAUZE SPONGE 4X4 16PLY XRAY LF (GAUZE/BANDAGES/DRESSINGS) IMPLANT
GLOVE BIOGEL PI IND STRL 8 (GLOVE) ×1 IMPLANT
GLOVE BIOGEL PI INDICATOR 8 (GLOVE) ×1
GLOVE ECLIPSE 7.5 STRL STRAW (GLOVE) ×6 IMPLANT
GLOVE ECLIPSE 8.5 STRL (GLOVE) ×2 IMPLANT
GOWN BRE IMP SLV AUR LG STRL (GOWN DISPOSABLE) ×2 IMPLANT
GOWN BRE IMP SLV AUR XL STRL (GOWN DISPOSABLE) ×4 IMPLANT
GOWN STRL REIN 2XL LVL4 (GOWN DISPOSABLE) IMPLANT
KIT BASIN OR (CUSTOM PROCEDURE TRAY) ×2 IMPLANT
KIT ROOM TURNOVER OR (KITS) ×2 IMPLANT
NEEDLE HYPO 22GX1.5 SAFETY (NEEDLE) ×2 IMPLANT
NEEDLE SPNL 22GX3.5 QUINCKE BK (NEEDLE) ×2 IMPLANT
NS IRRIG 1000ML POUR BTL (IV SOLUTION) ×2 IMPLANT
PACK LAMINECTOMY NEURO (CUSTOM PROCEDURE TRAY) ×2 IMPLANT
PAD ARMBOARD 7.5X6 YLW CONV (MISCELLANEOUS) ×6 IMPLANT
PATTIES SURGICAL .75X.75 (GAUZE/BANDAGES/DRESSINGS) ×2 IMPLANT
RUBBERBAND STERILE (MISCELLANEOUS) ×4 IMPLANT
SPONGE GAUZE 4X4 12PLY (GAUZE/BANDAGES/DRESSINGS) ×2 IMPLANT
SPONGE SURGIFOAM ABS GEL SZ50 (HEMOSTASIS) ×2 IMPLANT
STRIP CLOSURE SKIN 1/2X4 (GAUZE/BANDAGES/DRESSINGS) ×2 IMPLANT
SUT VIC AB 2-0 OS6 18 (SUTURE) ×6 IMPLANT
SUT VIC AB 3-0 CP2 18 (SUTURE) ×2 IMPLANT
SYR 20ML ECCENTRIC (SYRINGE) ×2 IMPLANT
TOOL FLUTED BALL 7MM (MISCELLANEOUS) ×2 IMPLANT
TOWEL OR 17X24 6PK STRL BLUE (TOWEL DISPOSABLE) ×2 IMPLANT
TOWEL OR 17X26 10 PK STRL BLUE (TOWEL DISPOSABLE) ×2 IMPLANT
WATER STERILE IRR 1000ML POUR (IV SOLUTION) ×2 IMPLANT

## 2012-06-11 NOTE — Anesthesia Postprocedure Evaluation (Signed)
Anesthesia Post Note  Patient: Ana Butler  Procedure(s) Performed: Procedure(s) (LRB): LUMBAR LAMINECTOMY/DECOMPRESSION MICRODISCECTOMY 1 LEVEL (Right)  Anesthesia type: General  Patient location: PACU  Post pain: Pain level controlled  Post assessment: Patient's Cardiovascular Status Stable  Last Vitals:  Filed Vitals:   06/11/12 1350  BP: 120/78  Pulse: 83  Temp: 36.6 C  Resp: 18    Post vital signs: Reviewed and stable  Level of consciousness: alert  Complications: No apparent anesthesia complications

## 2012-06-11 NOTE — Progress Notes (Signed)
Pt given D/C instructions with verbal understanding. Pt D/C'd home with family via wheelchair @ 1600 per MD order. Rema Fendt, RN

## 2012-06-11 NOTE — Op Note (Signed)
Preop diagnosis: Herniated disc L5-S1 right with S1 nerve root compression Postop diagnosis: Same Procedure: Right L5-S1 intralaminar laminotomy for excision of herniated disc with the operative microscope Surgeon: Zechariah Bissonnette Assistant: Pool  After being placed in the prone position the patient's back was prepped and draped in the usual sterile fashion. Localizing x-ray was taken prior to incision to identify the appropriate level. Midline incision was made above the spinous processes of L5 and S1. Using Bovie cutting current the incision was carried on the spinous processes. Subperiosteal dissection was then carried out on the right side of the spinous processes and lamina and self-retaining tract was placed for exposure. X-ray showed approach the appropriate level. Using the high-speed drill the inferior one third of the L5 lamina the medial one third of the facet joint the superior one third of the S1 lamina were removed. Residual bone was removed along with ligamentum flavum. At this time the microscope was draped brought into the field and used for the remainder of the case. Using microdissection technique the lateral aspect the thecal sac and S1 nerve were was identified. Further coagulation was carried out down to the floor the canal to identify the L5-S1 disc which is an be tremendously herniated beneath the nerve root causing a great stretch. In this was coagulated and then incised with a 15 blade and the disc space thoroughly cleaned out with pituitary rongeurs and curettes. Thorough displaced cleanout was carried out while the same time great care was taken to avoid injury to the neural elements and this was successfully done. At this point inspection was carried out in all directions for any evidence of residual compression and none could be identified. The nerve was well decompressed in the lumbar and or any stretch. The was then irrigated copiously metabolic irrigation and any bleeding controlled  with upper coagulation and Gelfoam. The was then closed in multiple layers of Vicryl on the muscle fascia subcutaneous and subcuticular tissues. Dermabond and Steri-Strips were placed on the skin. Shortness was then applied and the patient was extubated and taken to recovery in stable condition.

## 2012-06-11 NOTE — Preoperative (Signed)
Beta Blockers   Reason not to administer Beta Blockers:Not Applicable 

## 2012-06-11 NOTE — Discharge Summary (Signed)
Physician Discharge Summary  Patient ID: Ana Butler MRN: [id number] DOB/AGE: 46-27-1967 46 y.o.  Admit date: 06/11/2012 Discharge date: 06/11/2012  Admission Diagnoses:  Discharge Diagnoses:  Active Problems:  * No active hospital problems. *    Discharged Condition: good  Hospital Course: Surgery am with lumbar discectomy. Did well. No issues. Up walking that afternoon, pain free. Home with specific instructions given.  Consults: None  Significant Diagnostic Studies: none  Treatments: surgery: R L5 S1 discectomy  Discharge Exam: Blood pressure 120/78, pulse 83, temperature 97.8 F (36.6 C), temperature source Oral, resp. rate 18, SpO2 97.00%. Incision/Wound:clean and dry   Disposition: 01-Home or Self Care  Discharge Orders    Future Orders Please Complete By Expires   Diet general      Discharge instructions      Comments:   Mostly bedrest. Get up 9 or 10 times each day and walk for 15-20 minutes each time. Very little sitting the first week. No riding in the car until your first post op appointment. If you had neck surgery...may shower from the chest down. If you had low back surgery....you may shower with a saran wrap covering over the incision. Take your pain medicine as needed...and other medicines that you are instructed to take. Call for an appointment...[phone].   Call MD for:  temperature >100.4      Call MD for:  persistant nausea and vomiting      Call MD for:  severe uncontrolled pain      Call MD for:  redness, tenderness, or signs of infection (pain, swelling, redness, odor or green/yellow discharge around incision site)      Call MD for:  difficulty breathing, headache or visual disturbances      Call MD for:  hives          Medication List     As of 06/11/2012  3:42 PM    TAKE these medications         albuterol 108 (90 BASE) MCG/ACT inhaler   Commonly known as: PROVENTIL HFA;VENTOLIN HFA   Inhale 2 puffs into the lungs daily as needed.  For wheezing.      beclomethasone 80 MCG/ACT inhaler   Commonly known as: QVAR   Inhale 1 puff into the lungs every 12 (twelve) hours.      clonazePAM 1 MG tablet   Commonly known as: KLONOPIN   Take 1 mg by mouth 3 (three) times daily as needed. For anxiety      DULoxetine 30 MG capsule   Commonly known as: CYMBALTA   Take 30 mg by mouth every evening.      FLONASE NA   Place 1 spray into the nose daily as needed. For congestion.      ketorolac 10 MG tablet   Commonly known as: TORADOL   Take 10 mg by mouth every 6 (six) hours.      lisinopril 20 MG tablet   Commonly known as: PRINIVIL,ZESTRIL   Take 20 mg by mouth at bedtime.      nitroGLYCERIN 0.4 MG SL tablet   Commonly known as: NITROSTAT   Place 0.4 mg under the tongue every 5 (five) minutes as needed. For chest pain.      omeprazole 20 MG capsule   Commonly known as: PRILOSEC   Take 20 mg by mouth 2 (two) times daily.      oxyCODONE-acetaminophen 10-325 MG per tablet   Commonly known as: PERCOCET   Take 1 tablet by mouth  6 (six) times daily.      pregabalin 100 MG capsule   Commonly known as: LYRICA   Take 100 mg by mouth 3 (three) times daily.      venlafaxine XR 75 MG 24 hr capsule   Commonly known as: EFFEXOR-XR   Take 225 mg by mouth at bedtime.         At home rest most of the time. Get up 9 or 10 times each day and take a 15 or 20 minute walk. No riding in the car and to your first postoperative appointment. If you have neck surgery you may shower from the chest down starting on the third postoperative day. If you had back surgery he may start showering on the third postoperative day with saran wrap wrapped around your incisional area 3 times. After the shower remove the saran wrap. Take pain medicine as needed and other medications as instructed. Call my office for an appointment.  SignedReinaldo Meeker, MD 06/11/2012, 3:42 PM

## 2012-06-11 NOTE — Anesthesia Preprocedure Evaluation (Signed)
Anesthesia Evaluation  Patient identified by MRN, date of birth, ID band Patient awake    Reviewed: Allergy & Precautions, H&P , NPO status , Patient's Chart, lab work & pertinent test results, reviewed documented beta blocker date and time   Airway Mallampati: II TM Distance: >3 FB Neck ROM: full    Dental   Pulmonary asthma ,  breath sounds clear to auscultation        Cardiovascular hypertension, On Medications negative cardio ROS  Rhythm:regular     Neuro/Psych PSYCHIATRIC DISORDERS negative neurological ROS     GI/Hepatic Neg liver ROS, GERD-  Medicated and Controlled,  Endo/Other  negative endocrine ROS  Renal/GU negative Renal ROS  negative genitourinary   Musculoskeletal   Abdominal   Peds  Hematology negative hematology ROS (+)   Anesthesia Other Findings See surgeon's H&P   Reproductive/Obstetrics negative OB ROS                           Anesthesia Physical Anesthesia Plan  ASA: III  Anesthesia Plan: General   Post-op Pain Management:    Induction: Intravenous  Airway Management Planned: Oral ETT  Additional Equipment:   Intra-op Plan:   Post-operative Plan: Extubation in OR  Informed Consent: I have reviewed the patients History and Physical, chart, labs and discussed the procedure including the risks, benefits and alternatives for the proposed anesthesia with the patient or authorized representative who has indicated his/her understanding and acceptance.   Dental Advisory Given  Plan Discussed with: CRNA and Surgeon  Anesthesia Plan Comments:         Anesthesia Quick Evaluation

## 2012-06-11 NOTE — Transfer of Care (Signed)
Immediate Anesthesia Transfer of Care Note  Patient: Ana Butler  Procedure(s) Performed: Procedure(s) (LRB) with comments: LUMBAR LAMINECTOMY/DECOMPRESSION MICRODISCECTOMY 1 LEVEL (Right) - Right lumbar five sacral one microdiscectomy  Patient Location: PACU  Anesthesia Type: General  Level of Consciousness: awake, alert  and oriented  Airway & Oxygen Therapy: Patient Spontanous Breathing and Patient connected to nasal cannula oxygen  Post-op Assessment: Report given to PACU RN and Post -op Vital signs reviewed and stable  Post vital signs: Reviewed and stable  Complications: No apparent anesthesia complications

## 2012-06-11 NOTE — Progress Notes (Signed)
UR COMPLETED  

## 2012-06-11 NOTE — H&P (Signed)
Ana Butler is an 46 y.o. female.   Chief Complaint: Right lower extremity pain HPI: The patient is a 46 year old female who was seen in the office with a complaint of right lower extremity pain. She been seen in the past with bilateral pain but this was a definite change in her symptomatology. She was tried on conservative therapy without improvement and then underwent an MRI scan which showed a disc herniation at L5-S1 on the right. After discussing the options and failing additional conservative therapy the patient requested surgery and now comes for a lumbar microdiscectomy at L5-S1 on the right. I had a long discussion with her regarding the risks and benefits of surgical intervention. The risks discussed include but are not limited to bleeding infection weakness numbness paralysis spinal fluid leak coma and death. We have discussed alternative methods of therapy along with the risks and benefits of nonintervention. She has had the opportunity to ask numerous questions and appears to understand. With this information in hand she has requested we proceed with surgery.  Past Medical History  Diagnosis Date  . Acid reflux   . Depression   . Ovarian cyst   . DJD (degenerative joint disease), lumbar   . Tobacco abuse   . Anxiety   . Dysplasia of cervix, unspecified   . Allergic rhinitis, cause unspecified   . Insomnia, unspecified   . Degeneration of intervertebral disc, site unspecified   . Esophageal reflux   . Hirsutism   . Uterovaginal prolapse, incomplete   . Unspecified episodic mood disorder   . Other and unspecified ovarian cyst   . Unspecified hypertrophic and atrophic condition of skin   . Cancer     skin  . Asthma     last attack 2 yrs  . Hypertension     pcp  dr patel   Nicholes Rough    Past Surgical History  Procedure Date  . Cholecystectomy   . Abdominal hysterectomy   . Bilateral tubal ligation     Family History  Problem Relation Age of Onset  . Breast cancer  Sister   . Uterine cancer Mother   . Breast cancer Maternal Grandmother   . Uterine cancer Maternal Grandmother   . Hypertension    . Diabetes type II    . Heart attack Father 21  . Heart attack Paternal Grandmother 45  . Heart attack Maternal Aunt    Social History:  reports that she has been smoking Cigarettes.  She has a 64 pack-year smoking history. She does not have any smokeless tobacco history on file. She reports that she drinks alcohol. She reports that she uses illicit drugs (Marijuana).  Allergies:  Allergies  Allergen Reactions  . Prednisone Other (See Comments)    "psychological reaction"  . Shellfish Allergy Nausea And Vomiting and Swelling    Throat closes, etc.  . Silver Nitrate Other (See Comments)    Blisters, skin burning  . Codeine     hives  . Dilaudid (Hydromorphone Hcl) Other (See Comments)    migraine  . Vicodin (Hydrocodone-Acetaminophen) Nausea And Vomiting    Medications Prior to Admission  Medication Sig Dispense Refill  . albuterol (PROVENTIL HFA;VENTOLIN HFA) 108 (90 BASE) MCG/ACT inhaler Inhale 2 puffs into the lungs daily as needed. For wheezing.      . beclomethasone (QVAR) 80 MCG/ACT inhaler Inhale 1 puff into the lungs every 12 (twelve) hours.      . clonazePAM (KLONOPIN) 1 MG tablet Take 1 mg by mouth 3 (  three) times daily as needed. For anxiety      . DULoxetine (CYMBALTA) 30 MG capsule Take 30 mg by mouth every evening.      . Fluticasone Propionate (FLONASE NA) Place 1 spray into the nose daily as needed. For congestion.      Marland Kitchen ketorolac (TORADOL) 10 MG tablet Take 10 mg by mouth every 6 (six) hours.      Marland Kitchen lisinopril (PRINIVIL,ZESTRIL) 20 MG tablet Take 20 mg by mouth at bedtime.      . nitroGLYCERIN (NITROSTAT) 0.4 MG SL tablet Place 0.4 mg under the tongue every 5 (five) minutes as needed. For chest pain.      Marland Kitchen omeprazole (PRILOSEC) 20 MG capsule Take 20 mg by mouth 2 (two) times daily.      Marland Kitchen oxyCODONE-acetaminophen (PERCOCET) 10-325  MG per tablet Take 1 tablet by mouth 6 (six) times daily.       . pregabalin (LYRICA) 100 MG capsule Take 100 mg by mouth 3 (three) times daily.       Marland Kitchen venlafaxine XR (EFFEXOR-XR) 75 MG 24 hr capsule Take 225 mg by mouth at bedtime.        No results found for this or any previous visit (from the past 48 hour(s)). No results found.  Review of systems not obtained due to patient factors.  Blood pressure 139/90, pulse 78, temperature 97.9 F (36.6 C), temperature source Oral, resp. rate 18, SpO2 97.00%.  The patient is awake or and oriented. She is no facial asymmetry. Her gait is nonantalgic. Reflexes are 1-2+ except for a decreased right ankle jerk. Her strength is intact her sensation is decreased on the lateral aspect of the right foot. Assessment/Plan Impression is that of an S1 radiculopathy do herniated disc L5-S1 on the right. The plan is for a right L5-S1 microdiscectomy.  Reinaldo Meeker, MD 06/11/2012, 9:49 AM

## 2012-06-12 ENCOUNTER — Encounter (HOSPITAL_COMMUNITY): Payer: Self-pay | Admitting: Neurosurgery

## 2012-06-14 ENCOUNTER — Encounter (HOSPITAL_COMMUNITY): Payer: Self-pay | Admitting: Emergency Medicine

## 2012-06-14 ENCOUNTER — Emergency Department (HOSPITAL_COMMUNITY)
Admission: EM | Admit: 2012-06-14 | Discharge: 2012-06-14 | Disposition: A | Discharge status: Home / Self Care | Payer: Medicaid Other | Attending: Emergency Medicine | Admitting: Emergency Medicine

## 2012-06-14 DIAGNOSIS — G8918 Other acute postprocedural pain: Secondary | ICD-10-CM

## 2012-06-14 DIAGNOSIS — K219 Gastro-esophageal reflux disease without esophagitis: Secondary | ICD-10-CM | POA: Insufficient documentation

## 2012-06-14 DIAGNOSIS — I10 Essential (primary) hypertension: Secondary | ICD-10-CM | POA: Insufficient documentation

## 2012-06-14 DIAGNOSIS — F3289 Other specified depressive episodes: Secondary | ICD-10-CM | POA: Insufficient documentation

## 2012-06-14 DIAGNOSIS — Z91013 Allergy to seafood: Secondary | ICD-10-CM | POA: Insufficient documentation

## 2012-06-14 DIAGNOSIS — F329 Major depressive disorder, single episode, unspecified: Secondary | ICD-10-CM | POA: Insufficient documentation

## 2012-06-14 DIAGNOSIS — Z79899 Other long term (current) drug therapy: Secondary | ICD-10-CM | POA: Insufficient documentation

## 2012-06-14 DIAGNOSIS — G4700 Insomnia, unspecified: Secondary | ICD-10-CM | POA: Insufficient documentation

## 2012-06-14 DIAGNOSIS — Z888 Allergy status to other drugs, medicaments and biological substances status: Secondary | ICD-10-CM | POA: Insufficient documentation

## 2012-06-14 DIAGNOSIS — M549 Dorsalgia, unspecified: Secondary | ICD-10-CM

## 2012-06-14 DIAGNOSIS — F17200 Nicotine dependence, unspecified, uncomplicated: Secondary | ICD-10-CM | POA: Insufficient documentation

## 2012-06-14 DIAGNOSIS — F411 Generalized anxiety disorder: Secondary | ICD-10-CM | POA: Insufficient documentation

## 2012-06-14 DIAGNOSIS — Z885 Allergy status to narcotic agent status: Secondary | ICD-10-CM | POA: Insufficient documentation

## 2012-06-14 MED ORDER — ONDANSETRON HCL 4 MG/2ML IJ SOLN
4.0000 mg | Freq: Once | INTRAMUSCULAR | Status: AC
  Administered 2012-06-14: 4 mg via INTRAVENOUS
  Filled 2012-06-14: qty 2

## 2012-06-14 MED ORDER — MORPHINE SULFATE 4 MG/ML IJ SOLN
6.0000 mg | Freq: Once | INTRAMUSCULAR | Status: AC
  Administered 2012-06-14: 6 mg via INTRAVENOUS
  Filled 2012-06-14: qty 2

## 2012-06-14 MED ORDER — OXYCODONE HCL 10 MG PO TABS: 10.0000 mg | ORAL_TABLET | Freq: Four times a day (QID) | ORAL | Status: DC | PRN

## 2012-06-14 MED ORDER — IBUPROFEN 800 MG PO TABS
800.0000 mg | ORAL_TABLET | Freq: Once | ORAL | Status: AC
  Administered 2012-06-14: 800 mg via ORAL
  Filled 2012-06-14: qty 1

## 2012-06-14 NOTE — ED Notes (Signed)
Called receiving Nurse Ginna in CDU to give report.

## 2012-06-14 NOTE — ED Notes (Signed)
Patient transported by [facility] for complaint of lower back pain starting approximately 12 hours ago.  Patient had lower back surgery and had a disc removal on Tuesday.  States pain stared back last night.

## 2012-06-14 NOTE — ED Notes (Signed)
Family at bedside. 

## 2012-06-14 NOTE — ED Provider Notes (Signed)
7:15 - discussed with and care transferred from [facility], PA-C from [place] [facility]. Patient to move to CDU for pain control after back surgery. No reported deficits. She had lumbar surgery 06-11-12 by Dr. Gerlene Fee.  8:48: - re-evaluation after receiving Morphine for pain. Patient reports she feels much better. Abdomen is nontender; surgical site in lumbar area has intact bandage that is not saturated with blood or pus. Mildly tender in immediate area without redness or swelling. Moving bilateral lower extremities without difficulty. She states she is ready to go home.   Rodena Medin, PA-C 06/14/12 [phone]

## 2012-06-27 NOTE — ED Provider Notes (Signed)
History     CSN: [id number]  Arrival date & time 06/14/12  1478   First MD Initiated Contact with Patient 06/14/12 [phone]      Chief Complaint  Patient presents with  . Back Pain    (Consider location/radiation/quality/duration/timing/severity/associated sxs/prior treatment) HPI The patient presents with lower back pain that started 1 day ago. The patient had lower back surgery on Tuesday of this week. The patient states that she felt much better following her surgery and may have overdone it by doing to much activity. The patient states that she is not having numbness, weakness, nausea, vomiting, abdominal pain, chest pain, shortness of breath, or fever. The patient states that she has not had her pain medicine prior to arrival. The patient denies anything makes anything better. The patient states that the pain is worse with movements and palpation. Past Medical History  Diagnosis Date  . Acid reflux   . Depression   . Ovarian cyst   . DJD (degenerative joint disease), lumbar   . Tobacco abuse   . Anxiety   . Dysplasia of cervix, unspecified   . Allergic rhinitis, cause unspecified   . Insomnia, unspecified   . Degeneration of intervertebral disc, site unspecified   . Esophageal reflux   . Hirsutism   . Uterovaginal prolapse, incomplete   . Unspecified episodic mood disorder   . Other and unspecified ovarian cyst   . Unspecified hypertrophic and atrophic condition of skin   . Cancer     skin  . Asthma     last attack 2 yrs  . Hypertension     pcp  dr patel   Nicholes Rough    Past Surgical History  Procedure Date  . Cholecystectomy   . Abdominal hysterectomy   . Bilateral tubal ligation   . Lumbar laminectomy/decompression microdiscectomy 06/11/2012    Procedure: LUMBAR LAMINECTOMY/DECOMPRESSION MICRODISCECTOMY 1 LEVEL;  Surgeon: Reinaldo Meeker, MD;  Location: [facility];  Service: Neurosurgery;  Laterality: Right;  Right lumbar five sacral one microdiscectomy     Family History  Problem Relation Age of Onset  . Breast cancer Sister   . Uterine cancer Mother   . Breast cancer Maternal Grandmother   . Uterine cancer Maternal Grandmother   . Hypertension    . Diabetes type II    . Heart attack Father 91  . Heart attack Paternal Grandmother 45  . Heart attack Maternal Aunt     History  Substance Use Topics  . Smoking status: Current Every Day Smoker -- 2.0 packs/day for 32 years    Types: Cigarettes  . Smokeless tobacco: Not on file  . Alcohol Use: Yes     Comment: 3-4 mixed drinks 1-2 times per week,mo    OB History    Grav Para Term Preterm Abortions TAB SAB Ect Mult Living                  Review of Systems All other systems negative except as documented in the HPI. All pertinent positives and negatives as reviewed in the HPI.  Allergies  Prednisone; Shellfish allergy; Silver nitrate; Codeine; Dilaudid; and Vicodin  Home Medications   Current Outpatient Rx  Name  Route  Sig  Dispense  Refill  . ALBUTEROL SULFATE HFA 108 (90 BASE) MCG/ACT IN AERS   Inhalation   Inhale 2 puffs into the lungs daily as needed. For wheezing.         . BECLOMETHASONE DIPROPIONATE 80 MCG/ACT IN AERS  Inhalation   Inhale 1 puff into the lungs every 12 (twelve) hours.         Marland Kitchen CLONAZEPAM 1 MG PO TABS   Oral   Take 1 mg by mouth 3 (three) times daily as needed. For anxiety         . DIAZEPAM 5 MG PO TABS   Oral   Take 5 mg by mouth every 6 (six) hours.         . DULOXETINE HCL 30 MG PO CPEP   Oral   Take 30 mg by mouth every evening.         Marland Kitchen FLONASE NA   Nasal   Place 1 spray into the nose daily as needed. For congestion.         Marland Kitchen LISINOPRIL 20 MG PO TABS   Oral   Take 20 mg by mouth at bedtime.         Marland Kitchen NITROGLYCERIN 0.4 MG SL SUBL   Sublingual   Place 0.4 mg under the tongue every 5 (five) minutes as needed. For chest pain.         Marland Kitchen OMEPRAZOLE 20 MG PO CPDR   Oral   Take 20 mg by mouth 2 (two) times  daily.         . OXYCODONE-ACETAMINOPHEN 10-325 MG PO TABS   Oral   Take 1 tablet by mouth 6 (six) times daily.          Marland Kitchen PREGABALIN 100 MG PO CAPS   Oral   Take 100 mg by mouth 3 (three) times daily.          . VENLAFAXINE HCL ER 75 MG PO CP24   Oral   Take 225 mg by mouth at bedtime.         . OXYCODONE HCL 10 MG PO TABS   Oral   Take 1 tablet (10 mg total) by mouth every 6 (six) hours as needed.   20 tablet   0     BP 128/78  Pulse 79  Temp 98.1 F (36.7 C) (Oral)  Resp 16  SpO2 97%  Physical Exam  Nursing note and vitals reviewed. Constitutional: She is oriented to person, place, and time. She appears well-developed and well-nourished. She appears distressed.  Eyes: Pupils are equal, round, and reactive to light.  Cardiovascular: Normal rate, regular rhythm and normal heart sounds.  Exam reveals no gallop and no friction rub.   No murmur heard. Pulmonary/Chest: Effort normal and breath sounds normal. No respiratory distress.  Neurological: She is alert and oriented to person, place, and time. She has normal strength. No sensory deficit.  Reflex Scores:      Patellar reflexes are 2+ on the right side and 2+ on the left side.      Achilles reflexes are 2+ on the right side and 2+ on the left side.   ED Course  Procedures (including critical care time)  Patient has no neurodeficits noted on exam. She is placed in the CDU for further care and management. The patient will be referred back to her neurosurgeon. The patient is feeling some better after her first round of medications.  1. Back pain   2. Post-operative pain       MDM  MDM Reviewed: previous chart, nursing note and vitals           Carlyle Dolly, PA-C 06/29/12 1155

## 2012-06-29 NOTE — ED Provider Notes (Signed)
Medical screening examination/treatment/procedure(s) were performed by non-physician practitioner and as supervising physician I was immediately available for consultation/collaboration.   Alizaya Oshea B. Bernette Mayers, MD 06/29/12 1404

## 2012-07-09 NOTE — ED Provider Notes (Signed)
Medical screening examination/treatment/procedure(s) were performed by non-physician practitioner and as supervising physician I was immediately available for consultation/collaboration.  Rosanne Ashing, MD 07/09/12 [phone]

## 2012-08-24 ENCOUNTER — Emergency Department (INDEPENDENT_AMBULATORY_CARE_PROVIDER_SITE_OTHER): Payer: Medicaid Other

## 2012-08-24 ENCOUNTER — Encounter (HOSPITAL_COMMUNITY): Payer: Self-pay | Admitting: Emergency Medicine

## 2012-08-24 ENCOUNTER — Emergency Department (HOSPITAL_COMMUNITY)
Admission: EM | Admit: 2012-08-24 | Discharge: 2012-08-24 | Disposition: A | Discharge status: Home / Self Care | Payer: Medicaid Other | Source: Home / Self Care

## 2012-08-24 DIAGNOSIS — J40 Bronchitis, not specified as acute or chronic: Secondary | ICD-10-CM

## 2012-08-24 LAB — INFLUENZA PANEL BY PCR (TYPE A & B)
H1N1 flu by pcr: NOT DETECTED
Influenza A By PCR: NEGATIVE
Influenza B By PCR: NEGATIVE

## 2012-08-24 MED ORDER — FLUCONAZOLE 200 MG PO TABS: 200.0000 mg | ORAL_TABLET | Freq: Once | ORAL | Status: AC

## 2012-08-24 MED ORDER — OSELTAMIVIR PHOSPHATE 75 MG PO CAPS: 75.0000 mg | ORAL_CAPSULE | Freq: Two times a day (BID) | ORAL | Status: DC

## 2012-08-24 MED ORDER — AMOXICILLIN 500 MG PO CAPS: 500.0000 mg | ORAL_CAPSULE | Freq: Three times a day (TID) | ORAL | Status: DC

## 2012-08-24 MED ORDER — HYDROCOD POLST-CHLORPHEN POLST 10-8 MG/5ML PO LQCR: 5.0000 mL | Freq: Two times a day (BID) | ORAL | Status: DC | PRN

## 2012-08-24 NOTE — ED Notes (Signed)
Waiting discharge papers 

## 2012-08-24 NOTE — ED Provider Notes (Signed)
History     CSN: [id number]  Arrival date & time 08/24/12  1102   None     Chief Complaint  Patient presents with  . URI    productive cough green sputum. low grade temp. wheezing. chest tightness.    Patient is a 47 y.o. female presenting with URI. The history is provided by the patient. No language interpreter was used.  URI The primary symptoms include fever, fatigue, cough, wheezing and myalgias. Primary symptoms do not include ear pain. The current episode started 3 to 5 days ago. This is a new problem. The problem has been gradually worsening.  The fever began 3 to 5 days ago.  Symptoms associated with the illness include rhinorrhea.  Fevers as hight as 100.8. Pt reports cough is productive of green secretions and associated w/ lots of coughing at night. Pt admits that entire family w/ similar symptoms. Pt has chronic COPD and continues to smoke though is currently using "electronic cigarettes in an attempt to stop smoking.    Past Medical History  Diagnosis Date  . Acid reflux   . Depression   . Ovarian cyst   . DJD (degenerative joint disease), lumbar   . Tobacco abuse   . Anxiety   . Dysplasia of cervix, unspecified   . Allergic rhinitis, cause unspecified   . Insomnia, unspecified   . Degeneration of intervertebral disc, site unspecified   . Esophageal reflux   . Hirsutism   . Uterovaginal prolapse, incomplete   . Unspecified episodic mood disorder   . Other and unspecified ovarian cyst   . Unspecified hypertrophic and atrophic condition of skin   . Cancer     skin  . Asthma     last attack 2 yrs  . Hypertension     pcp  dr patel   Nicholes Rough    Past Surgical History  Procedure Date  . Cholecystectomy   . Abdominal hysterectomy   . Bilateral tubal ligation   . Lumbar laminectomy/decompression microdiscectomy 06/11/2012    Procedure: LUMBAR LAMINECTOMY/DECOMPRESSION MICRODISCECTOMY 1 LEVEL;  Surgeon: Reinaldo Meeker, MD;  Location: [facility];  Service:  Neurosurgery;  Laterality: Right;  Right lumbar five sacral one microdiscectomy    Family History  Problem Relation Age of Onset  . Breast cancer Sister   . Uterine cancer Mother   . Breast cancer Maternal Grandmother   . Uterine cancer Maternal Grandmother   . Hypertension    . Diabetes type II    . Heart attack Father 60  . Heart attack Paternal Grandmother 45  . Heart attack Maternal Aunt     History  Substance Use Topics  . Smoking status: Current Every Day Smoker -- 2.0 packs/day for 32 years    Types: Cigarettes  . Smokeless tobacco: Not on file  . Alcohol Use: Yes     Comment: 3-4 mixed drinks 1-2 times per week,mo    OB History    Grav Para Term Preterm Abortions TAB SAB Ect Mult Living                  Review of Systems  Constitutional: Positive for fever and fatigue.  HENT: Positive for rhinorrhea. Negative for ear pain, sneezing, neck pain and ear discharge.   Eyes: Negative for discharge.  Respiratory: Positive for cough and wheezing.   Musculoskeletal: Positive for myalgias.  All other systems reviewed and are negative.    Allergies  Prednisone; Shellfish allergy; Silver nitrate; Codeine; Dilaudid; and Vicodin  Home Medications   Current Outpatient Rx  Name  Route  Sig  Dispense  Refill  . ALBUTEROL SULFATE HFA 108 (90 BASE) MCG/ACT IN AERS   Inhalation   Inhale 2 puffs into the lungs daily as needed. For wheezing.         . BECLOMETHASONE DIPROPIONATE 80 MCG/ACT IN AERS   Inhalation   Inhale 1 puff into the lungs every 12 (twelve) hours.         . DULOXETINE HCL 30 MG PO CPEP   Oral   Take 30 mg by mouth every evening.         Marland Kitchen FLONASE NA   Nasal   Place 1 spray into the nose daily as needed. For congestion.         Marland Kitchen LISINOPRIL 20 MG PO TABS   Oral   Take 20 mg by mouth at bedtime.         Marland Kitchen NITROGLYCERIN 0.4 MG SL SUBL   Sublingual   Place 0.4 mg under the tongue every 5 (five) minutes as needed. For chest pain.           Marland Kitchen OMEPRAZOLE 20 MG PO CPDR   Oral   Take 20 mg by mouth 2 (two) times daily.         Marland Kitchen PREGABALIN 100 MG PO CAPS   Oral   Take 100 mg by mouth 3 (three) times daily.          . VENLAFAXINE HCL ER 75 MG PO CP24   Oral   Take 225 mg by mouth at bedtime.         Marland Kitchen CLONAZEPAM 1 MG PO TABS   Oral   Take 1 mg by mouth 3 (three) times daily as needed. For anxiety         . DIAZEPAM 5 MG PO TABS   Oral   Take 5 mg by mouth every 6 (six) hours.         . OXYCODONE HCL 10 MG PO TABS   Oral   Take 1 tablet (10 mg total) by mouth every 6 (six) hours as needed.   20 tablet   0   . OXYCODONE-ACETAMINOPHEN 10-325 MG PO TABS   Oral   Take 1 tablet by mouth 6 (six) times daily.            BP 123/83  Pulse 94  Temp 97.9 F (36.6 C) (Oral)  Resp 18  SpO2 99%  Physical Exam  Constitutional: She is oriented to person, place, and time. She appears well-developed and well-nourished.  HENT:  Head: Normocephalic and atraumatic.  Right Ear: External ear normal.  Left Ear: External ear normal.  Nose: Nose normal.  Mouth/Throat: Oropharynx is clear and moist.  Eyes: Conjunctivae normal are normal.  Neck: Normal range of motion. Neck supple.  Cardiovascular: Normal rate and regular rhythm.   Pulmonary/Chest: Effort normal and breath sounds normal.  Neurological: She is alert and oriented to person, place, and time.  Skin: Skin is warm and dry.  Psychiatric: She has a normal mood and affect.    ED Course  Procedures   Labs Reviewed - No data to display No results found.   No diagnosis found.    MDM  3-5 day h/o low grade fever, productive cough, wheezing and myalgias. Pt has h/o COPD. CXR w/o acute findings.        Leanne Chang, NP 08/25/12 1436

## 2012-08-24 NOTE — ED Notes (Signed)
Pt c/o productive cough with green sputum. Wheezing. Chest tightness. Low grade temp. Hx of copd. Pt has tried otc meds with no relief in symptoms. Symptoms present x 3 to 4 days.

## 2012-08-25 NOTE — ED Notes (Signed)
Flu Panel: None detected. Vassie Moselle 08/25/2012

## 2012-08-27 NOTE — ED Provider Notes (Signed)
Medical screening examination/treatment/procedure(s) were performed by resident physician or non-physician practitioner and as supervising physician I was immediately available for consultation/collaboration.   Vaida Kerchner DOUGLAS MD.    Etna Forquer D Mirko Tailor, MD 08/27/12 1806 

## 2013-03-10 ENCOUNTER — Ambulatory Visit: Payer: Self-pay | Admitting: Family Medicine

## 2013-04-24 ENCOUNTER — Encounter: Payer: Medicaid Other | Admitting: Radiology

## 2013-09-01 ENCOUNTER — Emergency Department (HOSPITAL_COMMUNITY): Payer: Medicaid Other

## 2013-09-01 ENCOUNTER — Emergency Department (HOSPITAL_COMMUNITY)
Admission: EM | Admit: 2013-09-01 | Discharge: 2013-09-01 | Disposition: A | Discharge status: Home / Self Care | Payer: Medicaid Other | Attending: Emergency Medicine | Admitting: Emergency Medicine

## 2013-09-01 ENCOUNTER — Encounter (HOSPITAL_COMMUNITY): Payer: Self-pay | Admitting: Emergency Medicine

## 2013-09-01 DIAGNOSIS — K5792 Diverticulitis of intestine, part unspecified, without perforation or abscess without bleeding: Secondary | ICD-10-CM

## 2013-09-01 DIAGNOSIS — Z79899 Other long term (current) drug therapy: Secondary | ICD-10-CM | POA: Insufficient documentation

## 2013-09-01 DIAGNOSIS — Z8742 Personal history of other diseases of the female genital tract: Secondary | ICD-10-CM | POA: Insufficient documentation

## 2013-09-01 DIAGNOSIS — Z8739 Personal history of other diseases of the musculoskeletal system and connective tissue: Secondary | ICD-10-CM | POA: Insufficient documentation

## 2013-09-01 DIAGNOSIS — F17200 Nicotine dependence, unspecified, uncomplicated: Secondary | ICD-10-CM | POA: Insufficient documentation

## 2013-09-01 DIAGNOSIS — I10 Essential (primary) hypertension: Secondary | ICD-10-CM | POA: Insufficient documentation

## 2013-09-01 DIAGNOSIS — Z872 Personal history of diseases of the skin and subcutaneous tissue: Secondary | ICD-10-CM | POA: Insufficient documentation

## 2013-09-01 DIAGNOSIS — J45909 Unspecified asthma, uncomplicated: Secondary | ICD-10-CM | POA: Insufficient documentation

## 2013-09-01 DIAGNOSIS — F329 Major depressive disorder, single episode, unspecified: Secondary | ICD-10-CM | POA: Insufficient documentation

## 2013-09-01 DIAGNOSIS — F3289 Other specified depressive episodes: Secondary | ICD-10-CM | POA: Insufficient documentation

## 2013-09-01 DIAGNOSIS — K5732 Diverticulitis of large intestine without perforation or abscess without bleeding: Secondary | ICD-10-CM | POA: Insufficient documentation

## 2013-09-01 DIAGNOSIS — F411 Generalized anxiety disorder: Secondary | ICD-10-CM | POA: Insufficient documentation

## 2013-09-01 DIAGNOSIS — R000 Tachycardia, unspecified: Secondary | ICD-10-CM | POA: Insufficient documentation

## 2013-09-01 DIAGNOSIS — Z85828 Personal history of other malignant neoplasm of skin: Secondary | ICD-10-CM | POA: Insufficient documentation

## 2013-09-01 DIAGNOSIS — Z9089 Acquired absence of other organs: Secondary | ICD-10-CM | POA: Insufficient documentation

## 2013-09-01 DIAGNOSIS — K219 Gastro-esophageal reflux disease without esophagitis: Secondary | ICD-10-CM | POA: Insufficient documentation

## 2013-09-01 DIAGNOSIS — IMO0002 Reserved for concepts with insufficient information to code with codable children: Secondary | ICD-10-CM | POA: Insufficient documentation

## 2013-09-01 DIAGNOSIS — Z9851 Tubal ligation status: Secondary | ICD-10-CM | POA: Insufficient documentation

## 2013-09-01 DIAGNOSIS — R4583 Excessive crying of child, adolescent or adult: Secondary | ICD-10-CM | POA: Insufficient documentation

## 2013-09-01 DIAGNOSIS — Z90710 Acquired absence of both cervix and uterus: Secondary | ICD-10-CM | POA: Insufficient documentation

## 2013-09-01 LAB — URINE MICROSCOPIC-ADD ON

## 2013-09-01 LAB — CBC WITH DIFFERENTIAL/PLATELET
BASOS ABS: 0 10*3/uL (ref 0.0–0.1)
BASOS PCT: 0 % (ref 0–1)
Eosinophils Absolute: 0.1 10*3/uL (ref 0.0–0.7)
Eosinophils Relative: 1 % (ref 0–5)
HCT: 42.1 % (ref 36.0–46.0)
HEMOGLOBIN: 14.7 g/dL (ref 12.0–15.0)
Lymphocytes Relative: 12 % (ref 12–46)
Lymphs Abs: 1.6 10*3/uL (ref 0.7–4.0)
MCH: 33.9 pg (ref 26.0–34.0)
MCHC: 34.9 g/dL (ref 30.0–36.0)
MCV: 97 fL (ref 78.0–100.0)
Monocytes Absolute: 1.2 10*3/uL — ABNORMAL HIGH (ref 0.1–1.0)
Monocytes Relative: 8 % (ref 3–12)
NEUTROS ABS: 11.1 10*3/uL — AB (ref 1.7–7.7)
NEUTROS PCT: 80 % — AB (ref 43–77)
PLATELETS: 278 10*3/uL (ref 150–400)
RBC: 4.34 MIL/uL (ref 3.87–5.11)
RDW: 13.9 % (ref 11.5–15.5)
WBC: 13.9 10*3/uL — ABNORMAL HIGH (ref 4.0–10.5)

## 2013-09-01 LAB — URINALYSIS, ROUTINE W REFLEX MICROSCOPIC
Bilirubin Urine: NEGATIVE
Glucose, UA: NEGATIVE mg/dL
HGB URINE DIPSTICK: NEGATIVE
Ketones, ur: NEGATIVE mg/dL
NITRITE: POSITIVE — AB
Protein, ur: NEGATIVE mg/dL
SPECIFIC GRAVITY, URINE: 1.02 (ref 1.005–1.030)
UROBILINOGEN UA: 4 mg/dL — AB (ref 0.0–1.0)
pH: 6 (ref 5.0–8.0)

## 2013-09-01 LAB — BASIC METABOLIC PANEL
BUN: 4 mg/dL — ABNORMAL LOW (ref 6–23)
CHLORIDE: 101 meq/L (ref 96–112)
CO2: 27 mEq/L (ref 19–32)
Calcium: 10.5 mg/dL (ref 8.4–10.5)
Creatinine, Ser: 0.64 mg/dL (ref 0.50–1.10)
Glucose, Bld: 112 mg/dL — ABNORMAL HIGH (ref 70–99)
POTASSIUM: 3.9 meq/L (ref 3.7–5.3)
SODIUM: 140 meq/L (ref 137–147)

## 2013-09-01 MED ORDER — ONDANSETRON HCL 4 MG/2ML IJ SOLN
4.0000 mg | Freq: Once | INTRAMUSCULAR | Status: DC
Start: 2013-09-01 — End: 2013-09-01

## 2013-09-01 MED ORDER — MORPHINE SULFATE 4 MG/ML IJ SOLN
4.0000 mg | INTRAMUSCULAR | Status: DC | PRN
  Administered 2013-09-01 (×2): 4 mg via INTRAVENOUS
  Filled 2013-09-01 (×2): qty 1

## 2013-09-01 MED ORDER — OXYCODONE-ACETAMINOPHEN 5-325 MG PO TABS
2.0000 | ORAL_TABLET | Freq: Once | ORAL | Status: AC
  Administered 2013-09-01: 2 via ORAL
  Filled 2013-09-01: qty 2

## 2013-09-01 MED ORDER — OXYCODONE-ACETAMINOPHEN 5-325 MG PO TABS: 1.0000 | ORAL_TABLET | ORAL | Status: AC | PRN

## 2013-09-01 MED ORDER — METRONIDAZOLE 500 MG PO TABS: 500.0000 mg | ORAL_TABLET | Freq: Two times a day (BID) | ORAL | Status: AC

## 2013-09-01 MED ORDER — CIPROFLOXACIN HCL 500 MG PO TABS: 500.0000 mg | ORAL_TABLET | Freq: Two times a day (BID) | ORAL | Status: DC

## 2013-09-01 MED ORDER — ONDANSETRON 4 MG PO TBDP: 4.0000 mg | ORAL_TABLET | Freq: Three times a day (TID) | ORAL | Status: AC | PRN

## 2013-09-01 MED ORDER — OXYCODONE-ACETAMINOPHEN 5-325 MG PO TABS: 2.0000 | ORAL_TABLET | ORAL | Status: DC | PRN

## 2013-09-01 MED ORDER — METRONIDAZOLE 500 MG PO TABS
500.0000 mg | ORAL_TABLET | Freq: Once | ORAL | Status: AC
  Administered 2013-09-01: 500 mg via ORAL
  Filled 2013-09-01: qty 1

## 2013-09-01 MED ORDER — ONDANSETRON HCL 4 MG/2ML IJ SOLN
4.0000 mg | Freq: Once | INTRAMUSCULAR | Status: AC
  Administered 2013-09-01: 4 mg via INTRAVENOUS
  Filled 2013-09-01: qty 2

## 2013-09-01 MED ORDER — METRONIDAZOLE 500 MG PO TABS: 500.0000 mg | ORAL_TABLET | Freq: Two times a day (BID) | ORAL | Status: DC

## 2013-09-01 MED ORDER — FENTANYL CITRATE 0.05 MG/ML IJ SOLN
INTRAMUSCULAR | Status: AC
  Filled 2013-09-01: qty 2

## 2013-09-01 MED ORDER — FENTANYL CITRATE 0.05 MG/ML IJ SOLN
100.0000 ug | Freq: Once | INTRAMUSCULAR | Status: AC
  Administered 2013-09-01: 100 ug via INTRAVENOUS

## 2013-09-01 MED ORDER — ONDANSETRON 4 MG PO TBDP: 4.0000 mg | ORAL_TABLET | Freq: Three times a day (TID) | ORAL | Status: DC | PRN

## 2013-09-01 MED ORDER — CIPROFLOXACIN HCL 500 MG PO TABS: 500.0000 mg | ORAL_TABLET | Freq: Two times a day (BID) | ORAL | Status: AC

## 2013-09-01 MED ORDER — ONDANSETRON 4 MG PO TBDP
4.0000 mg | ORAL_TABLET | Freq: Once | ORAL | Status: AC
  Administered 2013-09-01: 4 mg via ORAL
  Filled 2013-09-01: qty 1

## 2013-09-01 NOTE — ED Provider Notes (Signed)
CSN: [id number]     Arrival date & time 09/01/13  1106 History   First MD Initiated Contact with Patient 09/01/13 1222 This chart was scribed for Ana Furry, MD by Anastasia Pall, ED Scribe. This patient was seen in room [facility]14/[facility]14 and the patient's care was started at 12:23 PM.      Chief Complaint  Patient presents with  . Abdominal Cramping    The history is provided by the patient. No language interpreter was used.   HPI Comments: Ana Butler is a 48 y.o. female who presents to the Emergency Department complaining of gradually worsening, intermittent, cramping abdominal pain, that radiates down both her sides and to her back, onset 4 days ago. She reports her pain has severely increased today. She denies h/o similar abdominal pain. She reports h/o chronic back pain. She reports associated moderate diarrhea, but denies hematochezia. She denies being on any antibiotics recently. She reports h/o tubal hysterectomy, cholecystectomy. She thinks she still has her ovaries. She reports having h/o bowel blockage. She denies fever, and any other associated symptoms.   Past Medical History  Diagnosis Date  . Acid reflux   . Depression   . Ovarian cyst   . DJD (degenerative joint disease), lumbar   . Tobacco abuse   . Anxiety   . Dysplasia of cervix, unspecified   . Allergic rhinitis, cause unspecified   . Insomnia, unspecified   . Degeneration of intervertebral disc, site unspecified   . Esophageal reflux   . Hirsutism   . Uterovaginal prolapse, incomplete   . Unspecified episodic mood disorder   . Other and unspecified ovarian cyst   . Unspecified hypertrophic and atrophic condition of skin   . Cancer     skin  . Asthma     last attack 2 yrs  . Hypertension     pcp  dr patel   Lorina Rabon   Past Surgical History  Procedure Laterality Date  . Cholecystectomy    . Abdominal hysterectomy    . Bilateral tubal ligation    . Lumbar laminectomy/decompression microdiscectomy   06/11/2012    Procedure: LUMBAR LAMINECTOMY/DECOMPRESSION MICRODISCECTOMY 1 LEVEL;  Surgeon: Faythe Ghee, MD;  Location: [facility];  Service: Neurosurgery;  Laterality: Right;  Right lumbar five sacral one microdiscectomy   Family History  Problem Relation Age of Onset  . Breast cancer Sister   . Uterine cancer Mother   . Breast cancer Maternal Grandmother   . Uterine cancer Maternal Grandmother   . Hypertension    . Diabetes type II    . Heart attack Father 85  . Heart attack Paternal Grandmother 66  . Heart attack Maternal Aunt    History  Substance Use Topics  . Smoking status: Current Every Day Smoker -- 2.00 packs/day for 32 years    Types: Cigarettes  . Smokeless tobacco: Not on file  . Alcohol Use: No     Comment: 3-4 mixed drinks 1-2 times per week,mo   OB History   Grav Para Term Preterm Abortions TAB SAB Ect Mult Living                 Review of Systems  Constitutional: Negative for fever, chills, diaphoresis, appetite change and fatigue.  HENT: Negative for mouth sores, sore throat and trouble swallowing.   Eyes: Negative for visual disturbance.  Respiratory: Negative for cough, chest tightness, shortness of breath and wheezing.   Cardiovascular: Negative for chest pain.  Gastrointestinal: Positive for abdominal pain and  diarrhea. Negative for nausea, vomiting, blood in stool and abdominal distention.  Endocrine: Negative for polydipsia, polyphagia and polyuria.  Genitourinary: Negative for dysuria, frequency and hematuria.  Musculoskeletal: Positive for back pain. Negative for gait problem.  Skin: Negative for color change, pallor and rash.  Neurological: Negative for dizziness, syncope, light-headedness and headaches.  Hematological: Does not bruise/bleed easily.  Psychiatric/Behavioral: Negative for behavioral problems and confusion.    Allergies  Prednisone; Shellfish allergy; Silver nitrate; Codeine; Dilaudid; and Vicodin  Home Medications    Current Outpatient Rx  Name  Route  Sig  Dispense  Refill  . albuterol (PROVENTIL HFA;VENTOLIN HFA) 108 (90 BASE) MCG/ACT inhaler   Inhalation   Inhale 2 puffs into the lungs daily as needed. For wheezing.         . beclomethasone (QVAR) 80 MCG/ACT inhaler   Inhalation   Inhale 1 puff into the lungs every 12 (twelve) hours.         . Fluticasone Propionate (FLONASE NA)   Nasal   Place 1 spray into the nose daily as needed. For congestion.         Marland Kitchen ibuprofen (ADVIL,MOTRIN) 200 MG tablet   Oral   Take 800 mg by mouth every 6 (six) hours as needed.         Marland Kitchen lisinopril (PRINIVIL,ZESTRIL) 20 MG tablet   Oral   Take 20 mg by mouth at bedtime.         . nitroGLYCERIN (NITROSTAT) 0.4 MG SL tablet   Sublingual   Place 0.4 mg under the tongue every 5 (five) minutes as needed. For chest pain.         Marland Kitchen omeprazole (PRILOSEC) 20 MG capsule   Oral   Take 20 mg by mouth 2 (two) times daily.         . simethicone (MYLICON) 80 MG chewable tablet   Oral   Chew 160 mg by mouth every 6 (six) hours as needed for flatulence.         . venlafaxine XR (EFFEXOR-XR) 75 MG 24 hr capsule   Oral   Take 225 mg by mouth at bedtime.         . ciprofloxacin (CIPRO) 500 MG tablet   Oral   Take 1 tablet (500 mg total) by mouth every 12 (twelve) hours.   10 tablet   0   . metroNIDAZOLE (FLAGYL) 500 MG tablet   Oral   Take 1 tablet (500 mg total) by mouth 2 (two) times daily.   21 tablet   0   . ondansetron (ZOFRAN ODT) 4 MG disintegrating tablet   Oral   Take 1 tablet (4 mg total) by mouth every 8 (eight) hours as needed for nausea.   20 tablet   0   . oxyCODONE-acetaminophen (PERCOCET/ROXICET) 5-325 MG per tablet   Oral   Take 2 tablets by mouth every 4 (four) hours as needed.   6 tablet   0    BP 150/81  Pulse 96  Temp(Src) 98.4 F (36.9 C) (Oral)  Resp 20  Ht 5\' 3"  (1.6 m)  Wt 180 lb (81.647 kg)  BMI 31.89 kg/m2  SpO2 98%  Physical Exam   Constitutional: She is oriented to person, place, and time. She appears well-developed and well-nourished. She appears distressed (crying).  HENT:  Head: Normocephalic.  Eyes: Conjunctivae are normal. Pupils are equal, round, and reactive to light. No scleral icterus.  Neck: Normal range of motion. Neck supple. No thyromegaly present.  Cardiovascular: Regular rhythm.  Tachycardia present.  Exam reveals no gallop and no friction rub.   No murmur heard. Pulmonary/Chest: Effort normal and breath sounds normal. No respiratory distress. She has no wheezes. She has no rales.  Abdominal: Soft. Bowel sounds are normal. She exhibits no distension and no mass. There is tenderness (diffuse midline and lower abdominal tenderness). There is no rebound and no guarding.  Musculoskeletal: Normal range of motion.  Neurological: She is alert and oriented to person, place, and time.  Skin: Skin is warm and dry. No rash noted.  Psychiatric: She has a normal mood and affect. Her behavior is normal.    ED Course  Procedures (including critical care time)  DIAGNOSTIC STUDIES: Oxygen Saturation is 98% on room air, normal by my interpretation.    COORDINATION OF CARE: 12:29 PM-Discussed treatment plan which includes pain medication, CT abdomen with pt at bedside and pt agreed to plan.   Results for orders placed during the hospital encounter of 09/01/13  URINALYSIS, ROUTINE W REFLEX MICROSCOPIC      Result Value Range   Color, Urine YELLOW  YELLOW   APPearance CLEAR  CLEAR   Specific Gravity, Urine 1.020  1.005 - 1.030   pH 6.0  5.0 - 8.0   Glucose, UA NEGATIVE  NEGATIVE mg/dL   Hgb urine dipstick NEGATIVE  NEGATIVE   Bilirubin Urine NEGATIVE  NEGATIVE   Ketones, ur NEGATIVE  NEGATIVE mg/dL   Protein, ur NEGATIVE  NEGATIVE mg/dL   Urobilinogen, UA 4.0 (*) 0.0 - 1.0 mg/dL   Nitrite POSITIVE (*) NEGATIVE   Leukocytes, UA TRACE (*) NEGATIVE  URINE MICROSCOPIC-ADD ON      Result Value Range   WBC, UA  0-2  <3 WBC/hpf   Bacteria, UA FEW (*) RARE  CBC WITH DIFFERENTIAL      Result Value Range   WBC 13.9 (*) 4.0 - 10.5 K/uL   RBC 4.34  3.87 - 5.11 MIL/uL   Hemoglobin 14.7  12.0 - 15.0 g/dL   HCT 42.1  36.0 - 46.0 %   MCV 97.0  78.0 - 100.0 fL   MCH 33.9  26.0 - 34.0 pg   MCHC 34.9  30.0 - 36.0 g/dL   RDW 13.9  11.5 - 15.5 %   Platelets 278  150 - 400 K/uL   Neutrophils Relative % 80 (*) 43 - 77 %   Neutro Abs 11.1 (*) 1.7 - 7.7 K/uL   Lymphocytes Relative 12  12 - 46 %   Lymphs Abs 1.6  0.7 - 4.0 K/uL   Monocytes Relative 8  3 - 12 %   Monocytes Absolute 1.2 (*) 0.1 - 1.0 K/uL   Eosinophils Relative 1  0 - 5 %   Eosinophils Absolute 0.1  0.0 - 0.7 K/uL   Basophils Relative 0  0 - 1 %   Basophils Absolute 0.0  0.0 - 0.1 K/uL  BASIC METABOLIC PANEL      Result Value Range   Sodium 140  137 - 147 mEq/L   Potassium 3.9  3.7 - 5.3 mEq/L   Chloride 101  96 - 112 mEq/L   CO2 27  19 - 32 mEq/L   Glucose, Bld 112 (*) 70 - 99 mg/dL   BUN 4 (*) 6 - 23 mg/dL   Creatinine, Ser 0.64  0.50 - 1.10 mg/dL   Calcium 10.5  8.4 - 10.5 mg/dL   GFR calc non Af Amer >90  >90 mL/min   GFR  calc Af Amer >90  >90 mL/min   Ct Abdomen Pelvis Wo Contrast  09/01/2013   CLINICAL DATA:  Suprapubic pain and lower abdominal pain.  EXAM: CT ABDOMEN AND PELVIS WITHOUT CONTRAST  TECHNIQUE: Multidetector CT imaging of the abdomen and pelvis was performed following the standard protocol without intravenous contrast.  COMPARISON:  None.  FINDINGS: Lung bases are clear.  No pericardial fluid.  No focal hepatic lesion on this noncontrast exam. Post cholecystectomy. The pancreas, spleen, adrenal glands, kidneys are normal.  Stomach, small bowel, appendix, cecum are normal.  There is pericolonic inflammation involving the proximal sigmoid colon (image 73). This involves an 8 cm segment of proximal sigmoid colon were there are multiple diverticula noted. There is no evidence of abscess formation associated with this acute  diverticulitis. No macro perforation. The rectum is normal.  Abdominal aorta is normal caliber. No retroperitoneal periportal lymphadenopathy. No free fluid the pelvis. Post hysterectomy anatomy. The ovaries are normal. No aggressive osseous lesion.  IMPRESSION: 1. Moderate to severe acute non complicated sigmoid diverticulitis. 2. Consider follow-up imaging or colonoscopy to exclude underlying neoplasm.   Electronically Signed   By: Suzy Bouchard M.D.   On: 09/01/2013 13:54    EKG Interpretation   None      Medications  morphine 4 MG/ML injection 4 mg (4 mg Intravenous Given 09/01/13 1320)  ondansetron (ZOFRAN) injection 4 mg (4 mg Intravenous Given 09/01/13 1211)  ondansetron (ZOFRAN) injection 4 mg (4 mg Intravenous Given 09/01/13 1237)  fentaNYL (SUBLIMAZE) injection 100 mcg (100 mcg Intravenous Given 09/01/13 1429)  metroNIDAZOLE (FLAGYL) tablet 500 mg (500 mg Oral Given 09/01/13 1517)  oxyCODONE-acetaminophen (PERCOCET/ROXICET) 5-325 MG per tablet 2 tablet (2 tablets Oral Given 09/01/13 1517)  ondansetron (ZOFRAN-ODT) disintegrating tablet 4 mg (4 mg Oral Given 09/01/13 1516)    MDM   1. Diverticulitis        I personally performed the services described in this documentation, which was scribed in my presence. The recorded information has been reviewed and is accurate.    Ana Furry, MD 09/10/13 1520

## 2013-09-01 NOTE — ED Notes (Signed)
Complain of abdominal cramping for four days

## 2013-09-01 NOTE — Discharge Instructions (Signed)
Diverticulitis °A diverticulum is a small pouch or sac on the colon. Diverticulosis is the presence of these diverticula on the colon. Diverticulitis is the irritation (inflammation) or infection of diverticula. °CAUSES  °The colon and its diverticula contain bacteria. If food particles block the tiny opening to a diverticulum, the bacteria inside can grow and cause an increase in pressure. This leads to infection and inflammation and is called diverticulitis. °SYMPTOMS  °· Abdominal pain and tenderness. Usually, the pain is located on the left side of your abdomen. However, it could be located elsewhere. °· Fever. °· Bloating. °· Feeling sick to your stomach (nausea). °· Throwing up (vomiting). °· Abnormal stools. °DIAGNOSIS  °Your caregiver will take a history and perform a physical exam. Since many things can cause abdominal pain, other tests may be necessary. Tests may include: °· Blood tests. °· Urine tests. °· X-ray of the abdomen. °· CT scan of the abdomen. °Sometimes, surgery is needed to determine if diverticulitis or other conditions are causing your symptoms. °TREATMENT  °Most of the time, you can be treated without surgery. Treatment includes: °· Resting the bowels by only having liquids for a few days. As you improve, you will need to eat a low-fiber diet. °· Intravenous (IV) fluids if you are losing body fluids (dehydrated). °· Antibiotic medicines that treat infections may be given. °· Pain and nausea medicine, if needed. °· Surgery if the inflamed diverticulum has burst. °HOME CARE INSTRUCTIONS  °· Try a clear liquid diet (broth, tea, or water for as long as directed by your caregiver). You may then gradually begin a low-fiber diet as tolerated.  °A low-fiber diet is a diet with less than 10 grams of fiber. Choose the foods below to reduce fiber in the diet: °· White breads, cereals, rice, and pasta. °· Cooked fruits and vegetables or soft fresh fruits and vegetables without the skin. °· Ground or  well-cooked tender beef, ham, veal, lamb, pork, or poultry. °· Eggs and seafood. °· After your diverticulitis symptoms have improved, your caregiver may put you on a high-fiber diet. A high-fiber diet includes 14 grams of fiber for every 1000 calories consumed. For a standard 2000 calorie diet, you would need 28 grams of fiber. Follow these diet guidelines to help you increase the fiber in your diet. It is important to slowly increase the amount fiber in your diet to avoid gas, constipation, and bloating. °· Choose whole-grain breads, cereals, pasta, and brown rice. °· Choose fresh fruits and vegetables with the skin on. Do not overcook vegetables because the more vegetables are cooked, the more fiber is lost. °· Choose more nuts, seeds, legumes, dried peas, beans, and lentils. °· Look for food products that have greater than 3 grams of fiber per serving on the Nutrition Facts label. °· Take all medicine as directed by your caregiver. °· If your caregiver has given you a follow-up appointment, it is very important that you go. Not going could result in lasting (chronic) or permanent injury, pain, and disability. If there is any problem keeping the appointment, call to reschedule. °SEEK MEDICAL CARE IF:  °· Your pain does not improve. °· You have a hard time advancing your diet beyond clear liquids. °· Your bowel movements do not return to normal. °SEEK IMMEDIATE MEDICAL CARE IF:  °· Your pain becomes worse. °· You have an oral temperature above 102° F (38.9° C), not controlled by medicine. °· You have repeated vomiting. °· You have bloody or black, tarry stools. °·   Symptoms that brought you to your caregiver become worse or are not getting better. °MAKE SURE YOU:  °· Understand these instructions. °· Will watch your condition. °· Will get help right away if you are not doing well or get worse. °Document Released: 05/17/2005 Document Revised: 10/30/2011 Document Reviewed: 09/12/2010 °[employer]® Patient Information  ©2014 [employer]. ° °

## 2013-09-01 NOTE — ED Provider Notes (Signed)
Patient returned with prescriptions, not filled. His pharmacy, was contacted, and stated that the providers name relative prescriptions, is not on their Medicaid list.   I wrote prescriptions again, and destroyed the old ones.    Richarda Blade, MD 09/02/13 [phone]

## 2013-10-09 IMAGING — CR DG CHEST 2V
2 series · 2 of 2 positions shown · non-contrast
Comparison: None.

CLINICAL DATA: Chest pain.  COPD.

CHEST - 2 VIEW

[w chest pa]
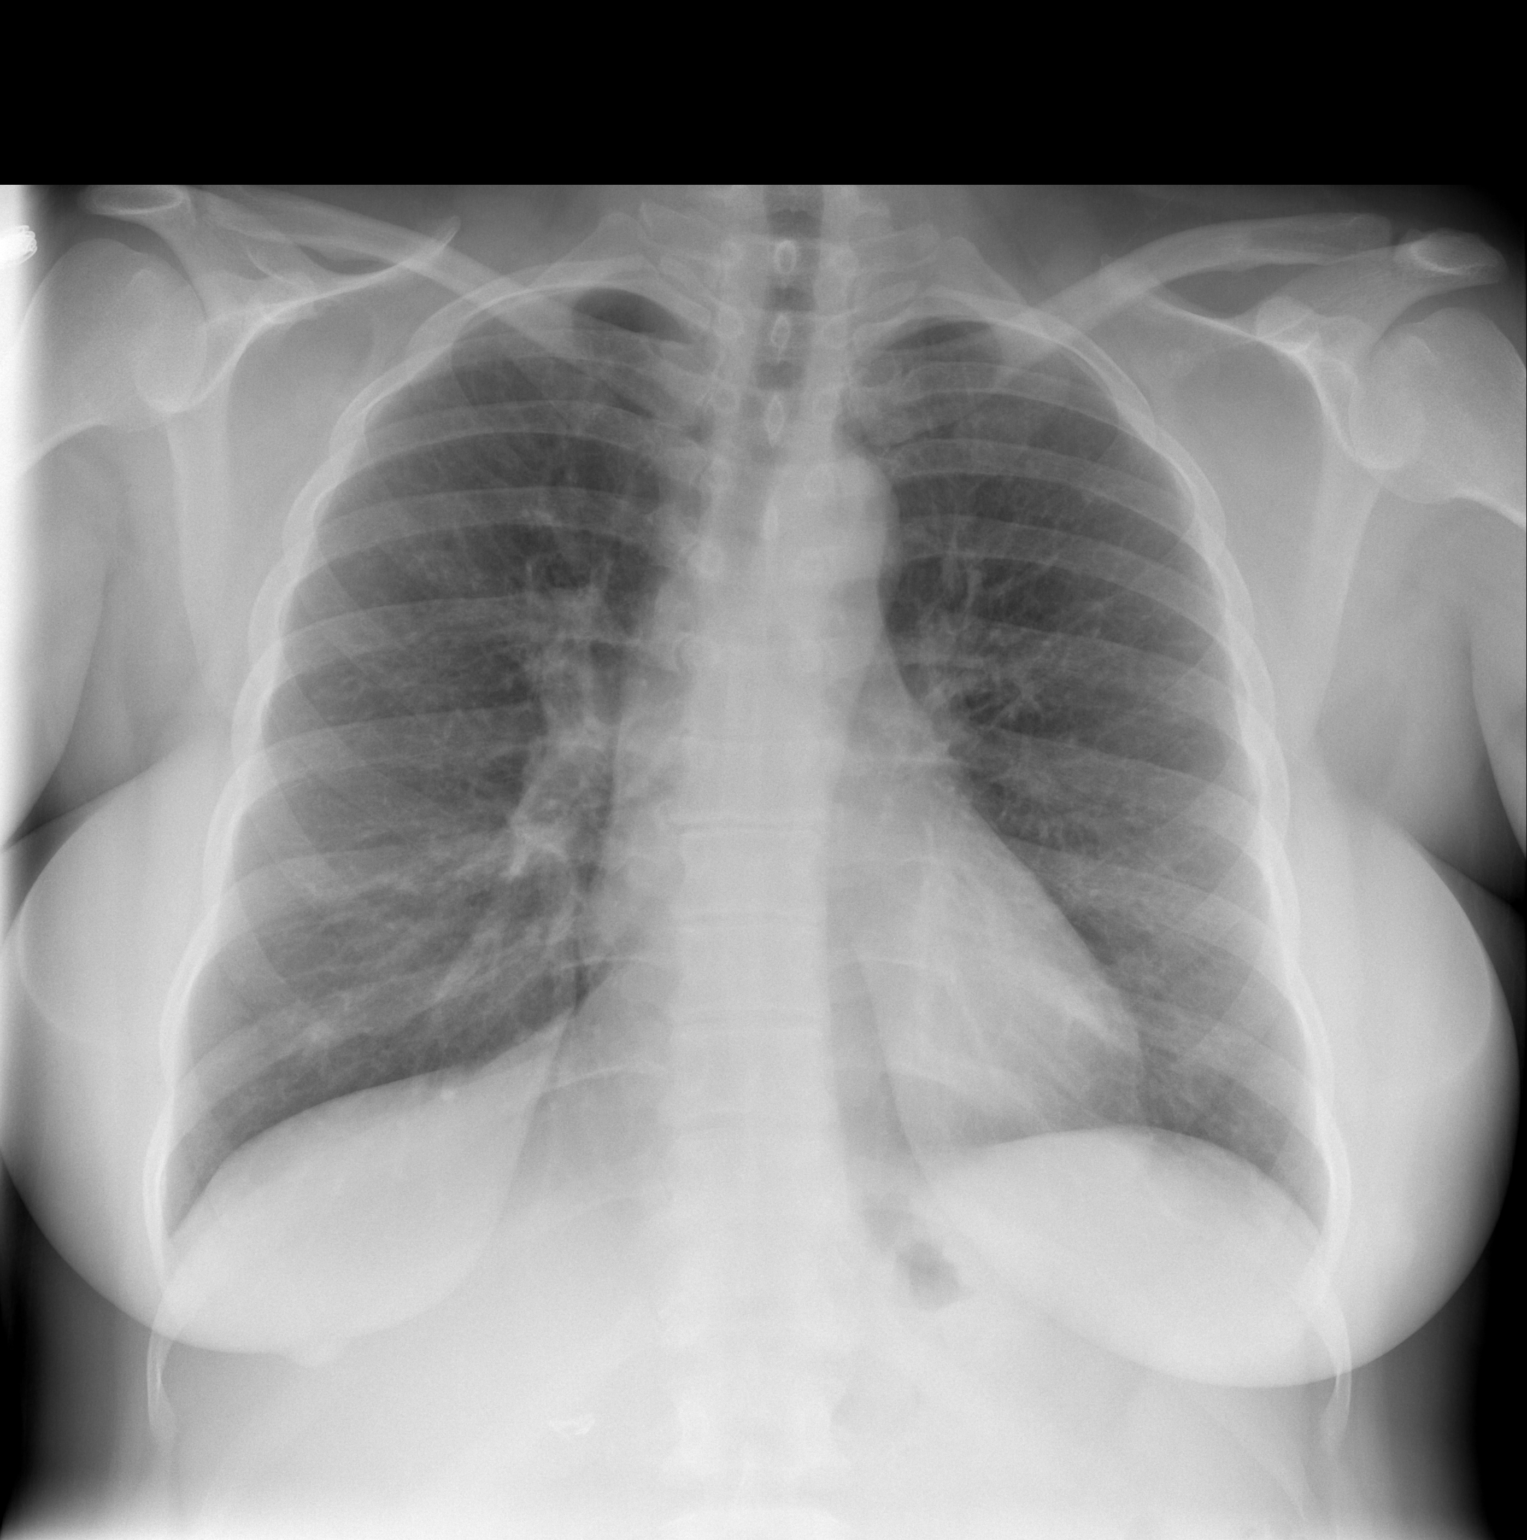

[w chest lat]
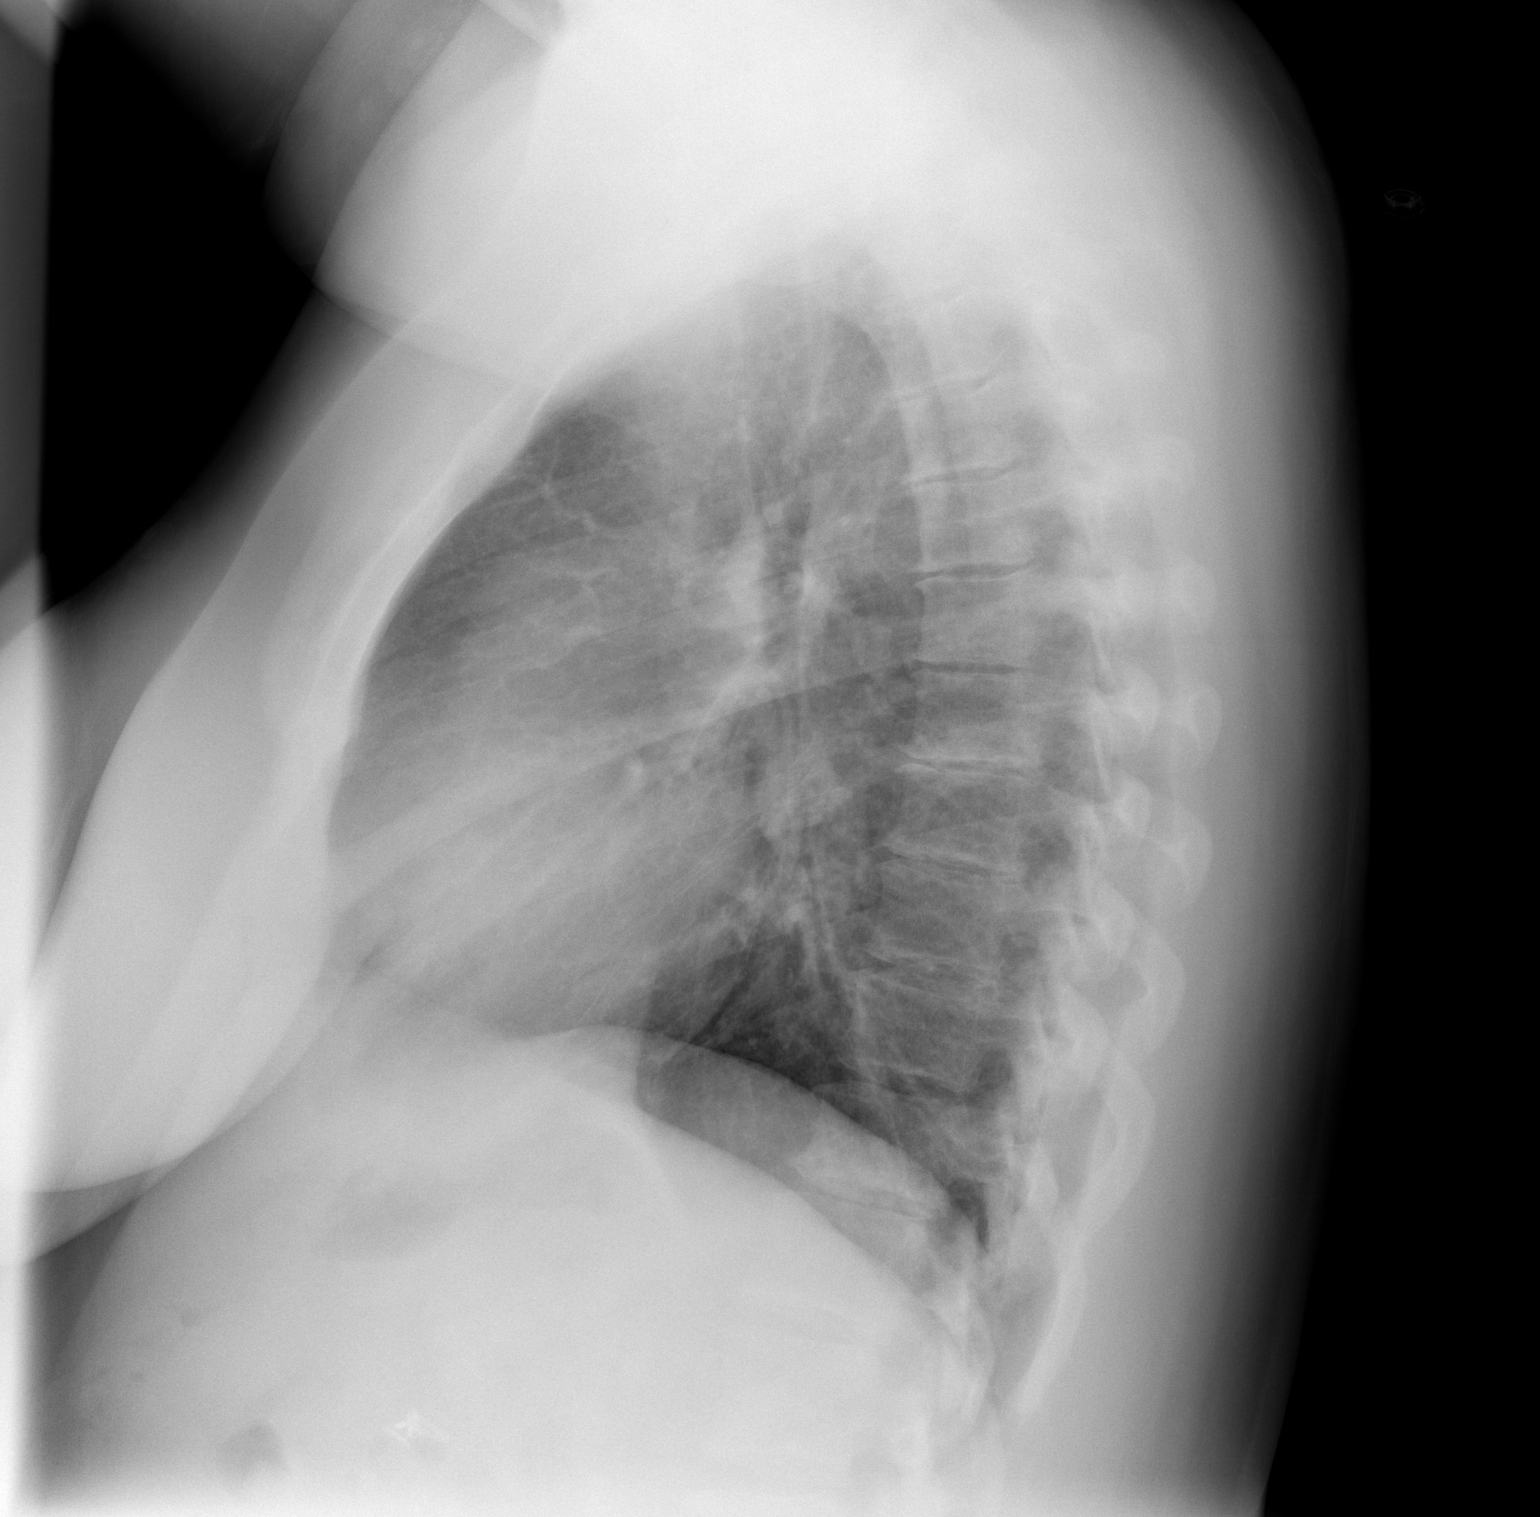

[2 of 2 positions shown; findings below may reference images not displayed]

FINDINGS: Lungs are clear.  Heart size is normal.  No pneumothorax
or effusion.  Degenerative change thoracic spine noted.
IMPRESSION: No acute finding.

## 2013-10-10 IMAGING — CR DG CHEST 2V
2 series · 2 of 2 positions shown · non-contrast
Comparison: 03/15/2012

CLINICAL DATA: Left-sided chest pain.  Smoker.  Hypertension.

CHEST - 2 VIEW

[w chest pa]
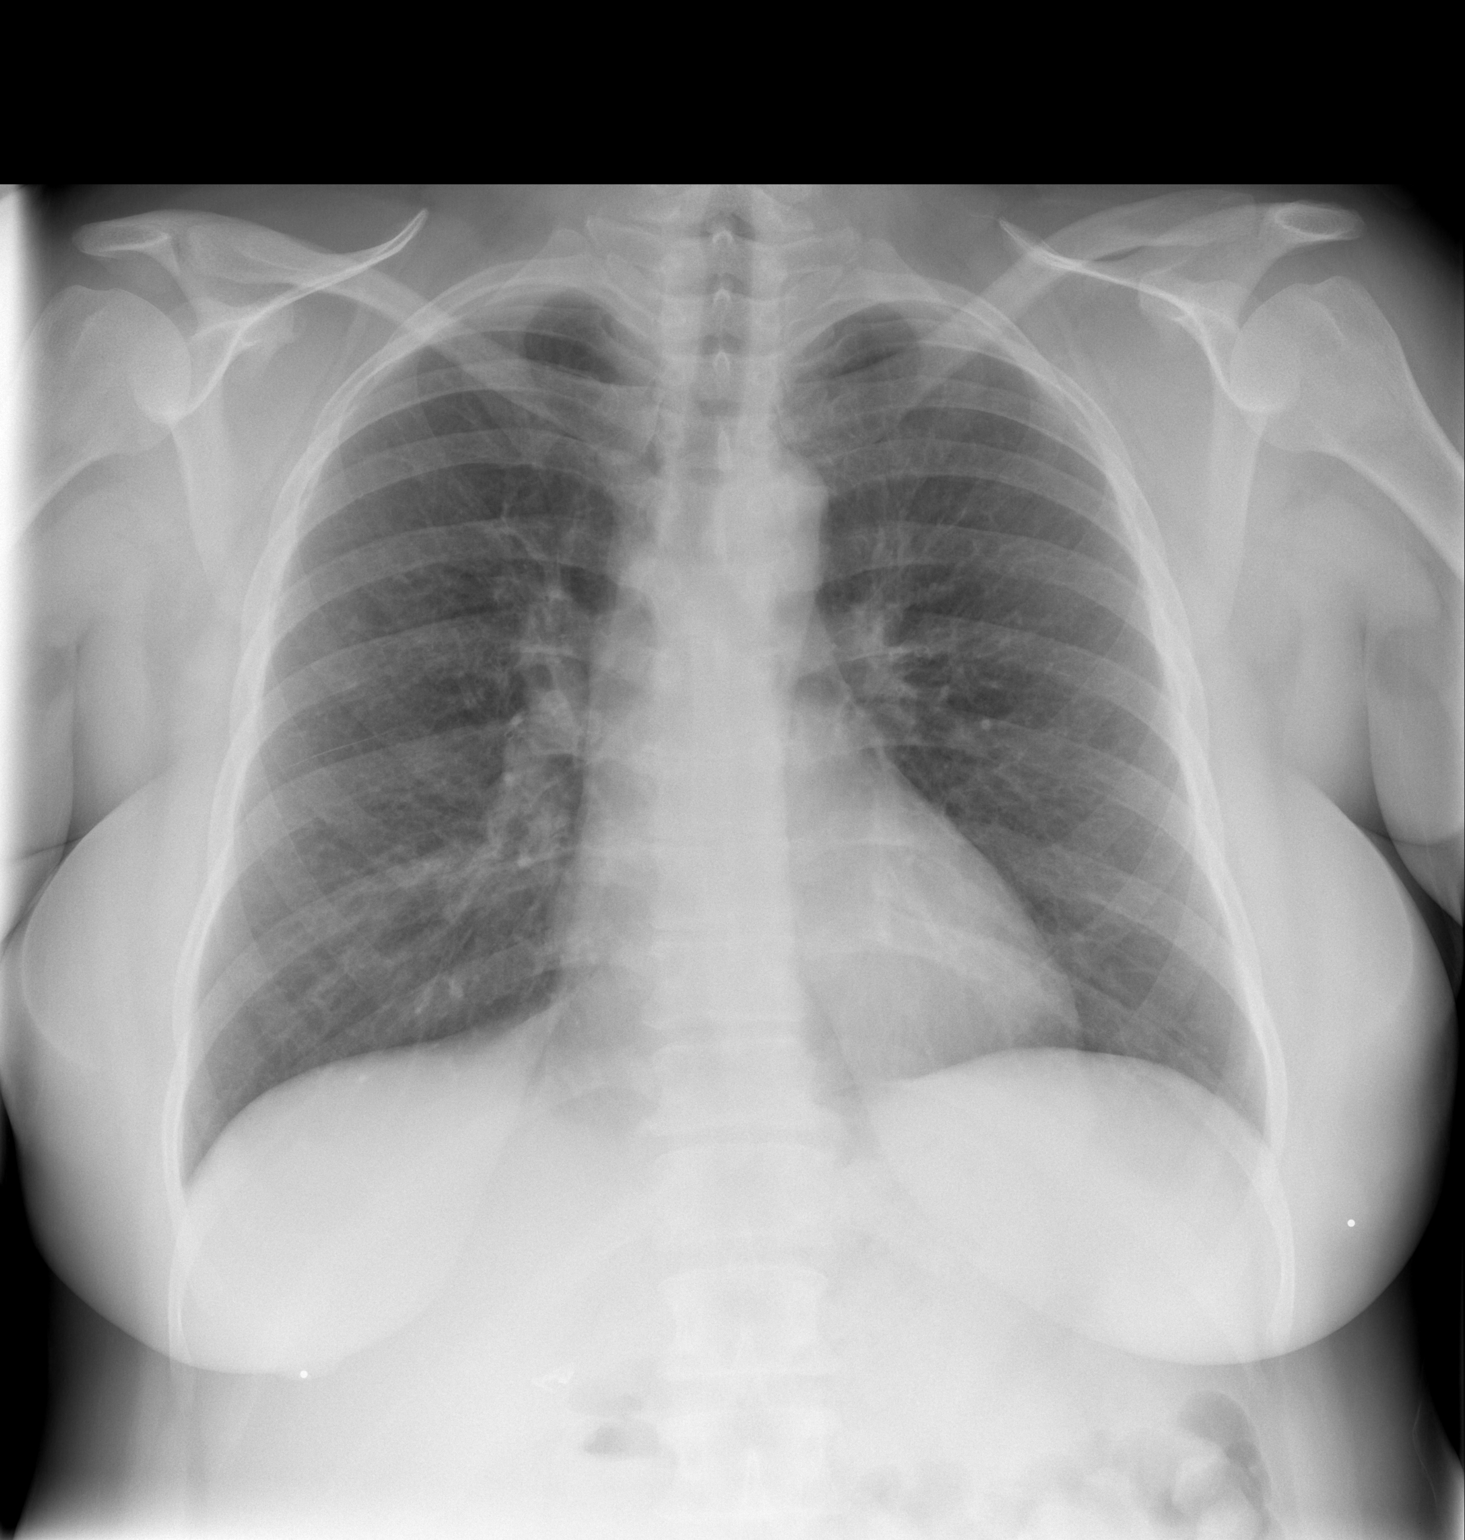

[w chest lat]
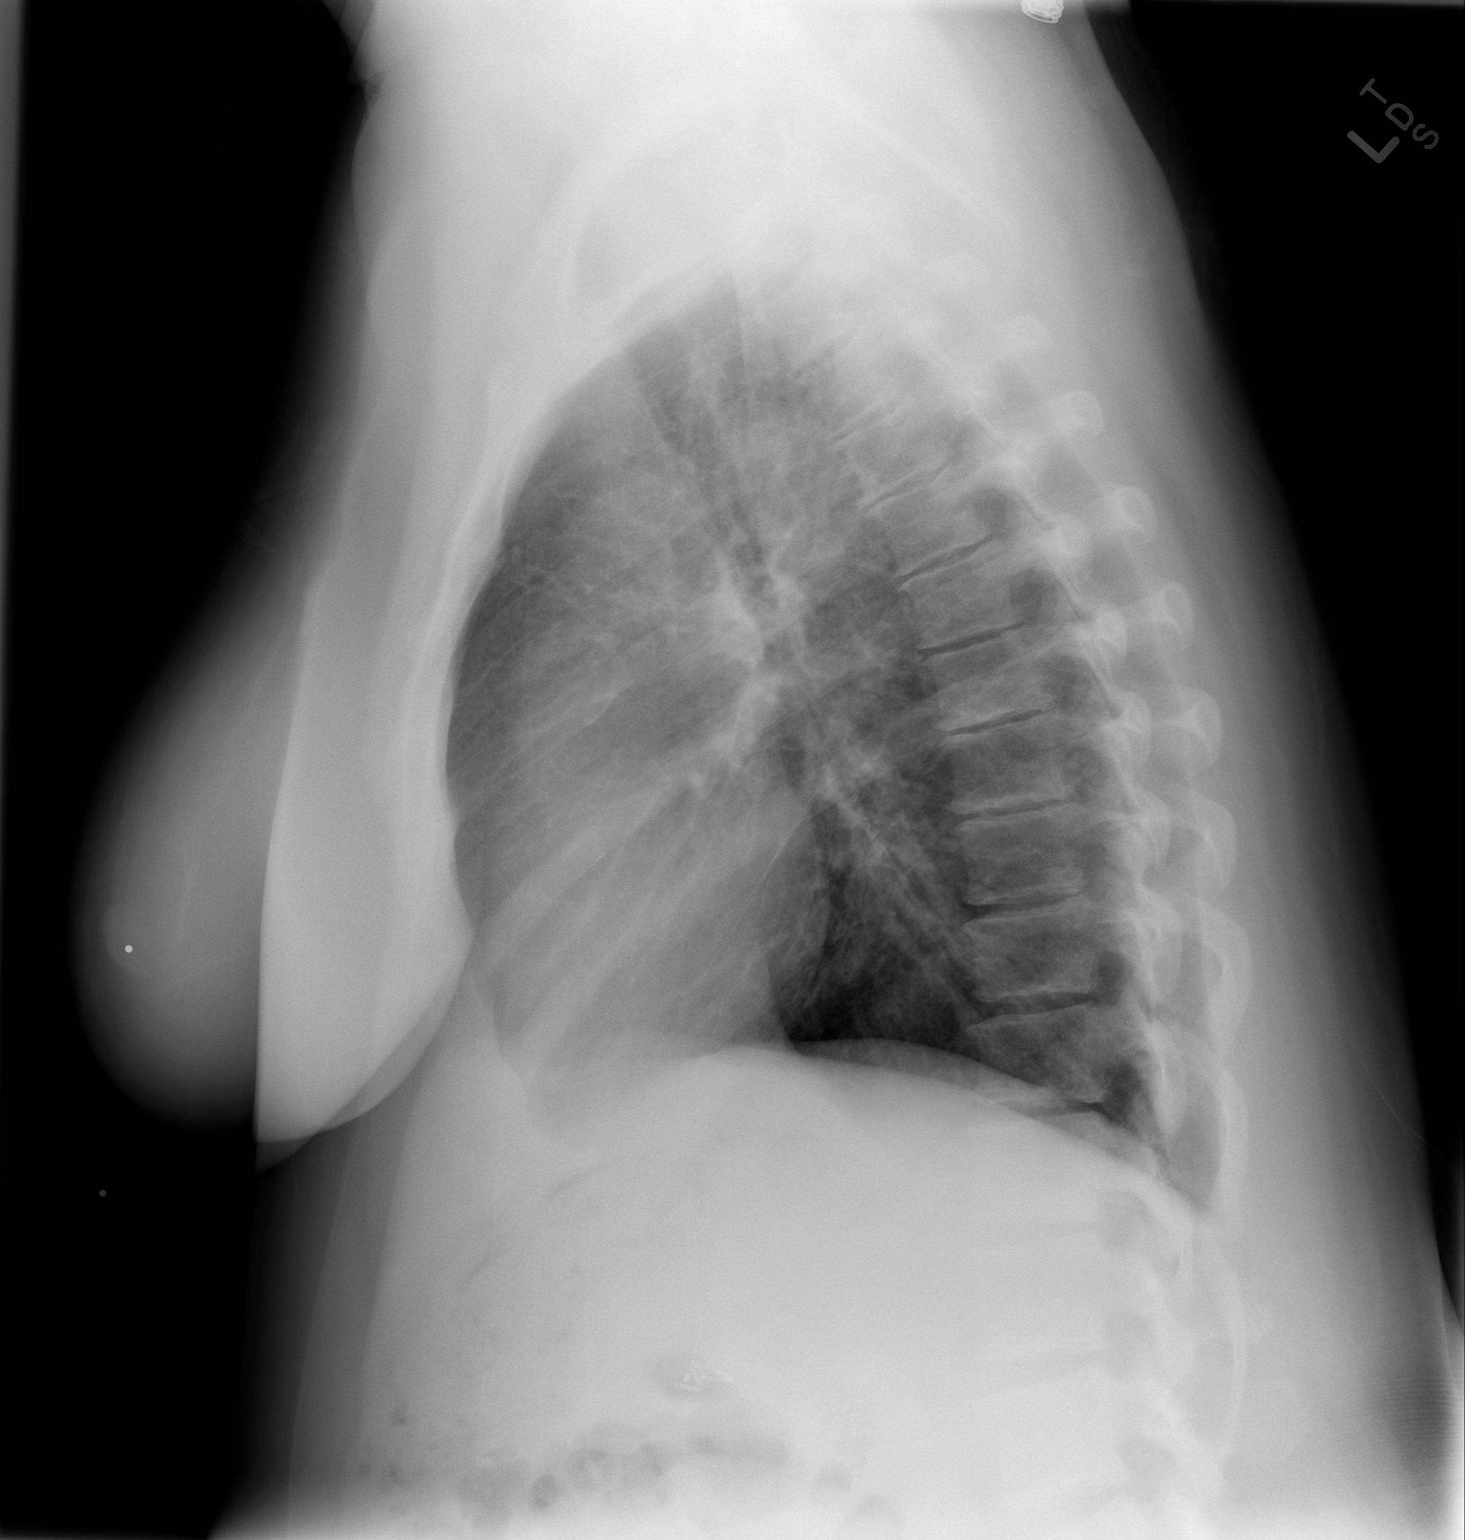

[2 of 2 positions shown; findings below may reference images not displayed]

FINDINGS: The heart size and mediastinal contours are within
normal limits.  Both lungs are clear.  Mild thoracic spine
degenerative disc disease again noted.
IMPRESSION: No active cardiopulmonary disease.
